# Patient Record
Sex: Female | Born: 1980 | Race: White | Hispanic: No | Marital: Married | Smoking: Former smoker
Health system: Southern US, Community
[De-identification: ages and names within clinical notes are randomized; demographics above are authoritative.]

## PROBLEM LIST (undated history)

## (undated) DIAGNOSIS — K219 Gastro-esophageal reflux disease without esophagitis: Secondary | ICD-10-CM

## (undated) DIAGNOSIS — E039 Hypothyroidism, unspecified: Secondary | ICD-10-CM

## (undated) DIAGNOSIS — F909 Attention-deficit hyperactivity disorder, unspecified type: Secondary | ICD-10-CM

## (undated) DIAGNOSIS — E119 Type 2 diabetes mellitus without complications: Secondary | ICD-10-CM

## (undated) DIAGNOSIS — F329 Major depressive disorder, single episode, unspecified: Secondary | ICD-10-CM

## (undated) DIAGNOSIS — F32A Depression, unspecified: Secondary | ICD-10-CM

## (undated) DIAGNOSIS — T4145XA Adverse effect of unspecified anesthetic, initial encounter: Secondary | ICD-10-CM

## (undated) DIAGNOSIS — F419 Anxiety disorder, unspecified: Secondary | ICD-10-CM

## (undated) DIAGNOSIS — M797 Fibromyalgia: Secondary | ICD-10-CM

## (undated) HISTORY — DX: Anxiety disorder, unspecified: F41.9

## (undated) HISTORY — PX: OTHER SURGICAL HISTORY: SHX169

## (undated) HISTORY — DX: Gastro-esophageal reflux disease without esophagitis: K21.9

## (undated) HISTORY — DX: Hypothyroidism, unspecified: E03.9

## (undated) HISTORY — DX: Attention-deficit hyperactivity disorder, unspecified type: F90.9

---

## 2001-05-01 ENCOUNTER — Other Ambulatory Visit: Admission: RE | Admit: 2001-05-01 | Discharge: 2001-05-01 | Payer: Self-pay | Admitting: Obstetrics & Gynecology

## 2001-09-18 ENCOUNTER — Ambulatory Visit (HOSPITAL_BASED_OUTPATIENT_CLINIC_OR_DEPARTMENT_OTHER): Admission: RE | Admit: 2001-09-18 | Discharge: 2001-09-18 | Payer: Self-pay | Admitting: Orthopedic Surgery

## 2003-12-19 ENCOUNTER — Other Ambulatory Visit: Admission: RE | Admit: 2003-12-19 | Discharge: 2003-12-19 | Payer: Self-pay | Admitting: Obstetrics & Gynecology

## 2004-05-21 ENCOUNTER — Encounter: Admission: RE | Admit: 2004-05-21 | Discharge: 2004-07-06 | Payer: Self-pay | Admitting: Obstetrics & Gynecology

## 2004-07-27 ENCOUNTER — Inpatient Hospital Stay (HOSPITAL_COMMUNITY): Admission: AD | Admit: 2004-07-27 | Discharge: 2004-07-31 | Payer: Self-pay | Admitting: Obstetrics & Gynecology

## 2004-07-28 ENCOUNTER — Encounter (INDEPENDENT_AMBULATORY_CARE_PROVIDER_SITE_OTHER): Payer: Self-pay | Admitting: *Deleted

## 2005-11-17 ENCOUNTER — Ambulatory Visit: Payer: Self-pay | Admitting: Family Medicine

## 2006-09-27 DIAGNOSIS — T8859XA Other complications of anesthesia, initial encounter: Secondary | ICD-10-CM

## 2006-09-27 HISTORY — DX: Other complications of anesthesia, initial encounter: T88.59XA

## 2006-10-22 ENCOUNTER — Emergency Department (HOSPITAL_COMMUNITY): Admission: EM | Admit: 2006-10-22 | Discharge: 2006-10-22 | Payer: Self-pay | Admitting: Emergency Medicine

## 2006-10-22 ENCOUNTER — Encounter: Payer: Self-pay | Admitting: Vascular Surgery

## 2007-03-13 ENCOUNTER — Inpatient Hospital Stay (HOSPITAL_COMMUNITY): Admission: RE | Admit: 2007-03-13 | Discharge: 2007-03-16 | Payer: Self-pay | Admitting: Obstetrics & Gynecology

## 2007-09-05 ENCOUNTER — Encounter: Admission: RE | Admit: 2007-09-05 | Discharge: 2007-09-05 | Payer: Self-pay | Admitting: Family Medicine

## 2010-10-18 ENCOUNTER — Encounter: Payer: Self-pay | Admitting: Family Medicine

## 2010-12-15 ENCOUNTER — Other Ambulatory Visit: Payer: Self-pay | Admitting: Family Medicine

## 2010-12-15 DIAGNOSIS — R1011 Right upper quadrant pain: Secondary | ICD-10-CM

## 2010-12-16 ENCOUNTER — Ambulatory Visit
Admission: RE | Admit: 2010-12-16 | Discharge: 2010-12-16 | Disposition: A | Discharge status: Home / Self Care | Payer: BC Managed Care – PPO | Source: Ambulatory Visit | Attending: Family Medicine | Admitting: Family Medicine

## 2010-12-16 DIAGNOSIS — R1011 Right upper quadrant pain: Secondary | ICD-10-CM

## 2011-02-09 NOTE — Op Note (Signed)
Ashley Mcgrath, Ashley Mcgrath                 ACCOUNT NO.:  [id number]   MEDICAL RECORD NO.:  [id number]          PATIENT TYPE:  INP   LOCATION:  9124                          FACILITY:  [facility]   PHYSICIAN:  Genia Del, M.D.DATE OF BIRTH:  [birth year]   DATE OF PROCEDURE:  03/13/2007  DATE OF DISCHARGE:                               OPERATIVE REPORT   PREOPERATIVE DIAGNOSIS:  38 weeks and 6 days gestation, previous C-  section.   POSTOPERATIVE DIAGNOSIS:  38 weeks and 6 days gestation, previous C-  section.   INTERVENTION:  Repeat low transverse C-section.   SURGEON:  Dr. Genia Del   ASSISTANT:  Marlinda Mike, C.N.M.   PROCEDURE:  Under spinal anesthesia the patient is in 15 degrees left  decubitus position.  She is prepped with Betadine on the abdominal,  suprapubic, and vulvar areas as well as vaginal area. A bladder catheter  is inserted, the Foley left in place and the patient is draped as usual.  The level of anesthesia was verified and is adequate.  We infiltrate 25%  Marcaine 20 mL at the level of the previous C-section scar.  We then  make a Pfannenstiel incision at that level with the scalpel.  We opened  the adipose tissue with the scalpel.  We opened the aponeurosis  transversely with Mayo scissors.  We then separate the aponeurosis from  the recti muscles on the midline with Mayo scissors.  We used  electrocautery to control hemostasis when needed.  We then opened the  parietal peritoneum longitudinally with [facility] scissors.  We put the  bladder retractor in place.  We opened the visceral peritoneum  transversely with Metz scissors, reclined the bladder downward away from  the lower uterine segment.  We then repositioned the bladder retractor.  We make a low transverse hysterotomy with the scalpel.  We extend on  each side with dressing scissors.  The amniotic fluid is clear.  The  fetus is in cephalic position.  Birth of a baby girl at 36. The baby  is suctioned,  the cord is clamped and cut. The baby is given to the  neonatal team.  Apgars are eight and nine. It is a baby girl.  We then  take the cord blood.  We then take cord blood for banking. We expelled  the placenta spontaneously and send it to labor and delivery. Uterine  revision is done.  The uterus contracts well with Pitocin IV. A dose of  Ancef 1 gram IV is given. The two tubes and the two ovaries are normal  to inspection.  The uterus is normal to inspection and well contracted.  We closed the hysterotomy in a first locked running suture of Vicryl 0.  A second layer in a mattress fashion is done with Vicryl 0 and a figure-  of-eight Vicryl 0 stitch is done on the right angle to complete  hemostasis.  Hemostasis is adequate at all levels.  We then irrigate and  suction the abdominopelvic cavity.  We complete hemostasis on the recti  muscles with the electrocautery.  We closed the  aponeurosis with two  half running sutures of Vicryl 0.  We complete hemostasis on the adipose  tissue and we  reapproximated the skin with staples.  Dry dressing is applied.  The  count of instruments and sponges was complete x2.  The estimated blood  loss was 800 mL.  No complications occurred and the patient was  transferred to recovery room in good stable status.      Genia Del, M.D.  Electronically Signed     ML/MEDQ  D:  03/13/2007  T:  03/13/2007  Job:  [id number]

## 2011-02-12 NOTE — Discharge Summary (Signed)
Ashley, Mcgrath                 ACCOUNT NO.:  [id number]   MEDICAL RECORD NO.:  [id number]          PATIENT TYPE:  INP   LOCATION:  9124                          [facility]  [facility]   PHYSICIAN:  Genia Del, M.D.DATE OF BIRTH:  02-29-1981   DATE OF ADMISSION:  03/13/2007  DATE OF DISCHARGE:  03/16/2007                               DISCHARGE SUMMARY   ADMISSION DIAGNOSIS:  38 weeks and 6 days gestation, previous C-section   DISCHARGE DIAGNOSIS:  38 weeks and 6 days gestation, previous C-section.  Birth of a baby girl by C-section.   INTERVENTION:  Repeat low transverse C-section.   HOSPITAL COURSE:  The patient had her repeat low transverse C-section on  March 13, 2007 without complications.  She had a normal postop course and  was discharged home on postop day #3 in stable status. Postop advice  were given.  She will follow-up at [facility] in 6 weeks.  Her  postop hemoglobin was 8.4 and hematocrit 25.2.      Genia Del, M.D.  Electronically Signed     ML/MEDQ  D:  04/23/2007  T:  04/24/2007  Job:  [id number]

## 2011-02-12 NOTE — Op Note (Signed)
[place]  Patient:    Ashley Mcgrath, Ashley Mcgrath Visit Number: [id number] MRN: [id number]          Service Type: DSU Location: [facility] Attending Physician:  Twana First Dictated by:   Elana Alm Thurston Hole, M.D. Proc. Date: 09/18/01 Admit Date:  09/18/2001                             Operative Report  PREOPERATIVE DIAGNOSIS: Left knee medial meniscus tear, with parameniscal cyst.  POSTOPERATIVE DIAGNOSIS: Left knee medial meniscus tear, with parameniscal cyst.  OPERATION/PROCEDURE:  1. Left knee examination under anesthesia followed by arthroscopic partial     medial meniscectomy.  2. L4-5 knee internal meniscal cyst debridement.  SURGEON: Elana Alm. Thurston Hole, M.D.  ASSISTANT: Julien Girt, P.A.  ANESTHESIA: General.  OPERATIVE TIME: Thirty minutes.  COMPLICATIONS: None.  INDICATIONS FOR PROCEDURE: [name] is a 30 year old, who has had over a year of left knee pain with intermittent popping and catching in the knee, with examination and MRI documenting a meniscus tear and a parameniscal cyst, who has failed conservative care and is now to undergo arthroscopy.  DESCRIPTION OF PROCEDURE: [name] was brought to the operating room on September 18, 2001 and placed on the operating room table in the supine position.  After an adequate level of general anesthesia was obtained her left knee was examined under anesthesia.  She had full range of motion and her knee was stable to ligamentous examination, with normal patella tracking.  The left leg was prepped using sterile Betadine and draped using sterile technique. Originally through an inferolateral portal the arthroscope with a pump attached was placed and through an inferomedial portal an arthroscopic probe was placed.  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  The medial meniscus  showed a split horizontal tear of the posterior and medial horn of the medial meniscus which was not repairable and the inferior leaflet was resected.  The superior leaflet remained intact.  The posterior medial corner where the cyst originated was debrided arthroscopically.  After this was done the intercondylar notch was inspected and the anterior and posterior cruciate ligaments were normal.  The lateral compartment was inspected and articular cartilage, lateral femoral condyle, lateral tibial plateau were normal.  The lateral meniscus was probed and this was found to be normal.  The patellofemoral joint was inspected and the articular cartilage in this joint was normal.  The patella tracked normally. Medial and lateral gutters were inspected and found to be free of pathology. After this was done it was felt that all pathology had been satisfactorily addressed.  The instruments were removed.  Portals were closed with 3-0 nylon suture and injected with 0.25% Marcaine with epinephrine and 4 mg of morphine. A sterile dressing was applied and the patient awakened and taken to the recovery room in stable condition.  FOLLOW-UP CARE: Shweta will be followed as an outpatient, on Talwin NX and Naprosyn.  I will see her back in the office in a week for sutures out and follow-up. Dictated by:   Elana Alm Thurston Hole, M.D. Attending Physician:  Twana First DD:  09/18/01 TD:  09/19/01 Job: [id number]

## 2011-02-12 NOTE — Op Note (Signed)
Ashley Mcgrath, Ashley Mcgrath                 ACCOUNT NO.:  [id number]   MEDICAL RECORD NO.:  [id number]          PATIENT TYPE:  INP   LOCATION:  9105                          [facility]ACILITY:  [facility]   PHYSICIAN:  [place] B. Earlene Plater, M.D.  DATE OF BIRTH:  [birth year]   DATE OF PROCEDURE:  07/28/2004  DATE OF DISCHARGE:                                 OPERATIVE REPORT   PREOPERATIVE DIAGNOSES:  1.  Forty-week intrauterine pregnancy.  2.  Pregnancy-induced hypertension.  3.  Failure to progress.  4.  Possible early chorioamnionitis.   POSTOPERATIVE DIAGNOSES:  1.  Forty-week intrauterine pregnancy.  2.  Pregnancy-induced hypertension.  3.  Failure to progress.  4.  Possible early chorioamnionitis.   PROCEDURE:  Primary low transverse cesarean section and cord blood  collection by viacord method.   SURGEON:  Chester Holstein. Earlene Plater, M.D.   ANESTHESIA:  Epidural.   FINDINGS:  A viable female, Apgars 8 and 9, weight 8 pounds 15 ounces, from  OP position, with nuchal cord x1. Normal-appearing uterus, tubes, and  ovaries.   ESTIMATED BLOOD LOSS:  600.   FLUIDS REPLACED:  2100.   URINE OUTPUT:  125.   COMPLICATIONS:  None.   INDICATIONS:  The patient was being induced for mild pregnancy-induced  hypertension.  She progressed slowly to 8 cm.  She would not dilate beyond  this for greater than four hours despite adequate labor documented by [facility].  Therefore, I recommended and she agreed to proceed with a C-section.  In  addition, she had a low-grade temperature of 100.5 and slight decrease in  variability on the tracing, suggestive of possible early mild  chorioamnionitis.  The risks of surgery were discussed prior to the  procedure, including infection, bleeding, damage to bowel, bladder, or  surrounding organs.   PROCEDURE:  The patient was taken to the operating room with epidural  anesthesia in place.  She was prepped and draped in standard fashion and a  Foley catheter was in the bladder.   A  Pfannenstiel incision made with a knife and carried sharply to the fascia.  The fascia was divided sharply.  The superior edge of the incision was  elevated and the underlying rectus muscles dissected off sharply.  Repeated  inferiorly in a similar fashion.   The midline was identified, posterior sheath and peritoneum elevated and  entered sharply.  Extended inferiorly sharply with good visualization of  surrounding organs.   Bladder blade inserted, bladder flap created with sharp and blunt technique.   The uterine incision made in a low transverse fashion with a knife.  Clear  fluid noted at amniotomy.  The incision extended laterally with bandage  scissors.   The infant's head was delivered through the incision and noted to be OP with  a nuchal cord x1, which was easily reduced.  The remainder of the infant was  delivered without difficulty, the cord clamped and cut, and the infant  handed off to the waiting pediatricians.  Ancef was given at cord clamp.   The cord blood was collected with the Viacord collection kit per  protocol.   The placenta was removed manually and the uterus exteriorized and cleared of  all clots and debris.  There was mild uterine atony with associated  hemorrhage, which was treated with double-concentrated Pitocin and a single  intrauterine injection of Hemabate.  Uterine massage was also employed, and  good uterine tone resulted.   The uterine incision was inspected and was free of extension.  The uterus  was cleared of all clots and debris.  The incision was closed in a running  locked stitch of 0 chromic.  A second layer placed of the same suture in  imbricating fashion.  Hemostasis obtained.   The uterus was returned to the abdomen and the pelvis irrigated.  The  uterine incision was inspected.  There were some bleeders along the superior  edge of the peritoneal incision, which were made hemostatic with the Bovie.  The bladder flap was hemostatic, as  was the subfascial space.   The fascia was closed in a running stitch of 0 Vicryl.  The subcutaneous  tissue was irrigated and made hemostatic with the Bovie.  The skin was  closed with staples.   The patient tolerated the procedure well, no complications.  She was taken  to the recovery room in stable condition.  All counts were correct per the  operating room staff.     Wesl   WBD/MEDQ  D:  07/28/2004  T:  07/29/2004  Job:  [id number]

## 2011-02-12 NOTE — Discharge Summary (Signed)
Ashley Mcgrath, Ashley Mcgrath                 ACCOUNT NO.:  [id number]   MEDICAL RECORD NO.:  [id number]          PATIENT TYPE:  INP   LOCATION:  9105                          [facility]   PHYSICIAN:  [place] B. Earlene Plater, M.D.  DATE OF BIRTH:  [birth year]   DATE OF ADMISSION:  07/27/2004  DATE OF DISCHARGE:  07/31/2004                                 DISCHARGE SUMMARY   ADMISSION DIAGNOSES:  1.  A 40-6/7 weeks intra-uterine pregnancy.  2.  Pregnancy-induced hypertension.  3.  Failure to progress beyond 8 cm.  4.  Chorioamnionitis.   DISCHARGE DIAGNOSES:  1.  A 40-6/7 weeks intra-uterine pregnancy.  2.  Pregnancy-induced hypertension.  3.  Failure to progress beyond 8 cm.  4.  Chorioamnionitis.   PROCEDURE:  Admission for induction of labor and subsequent primary low  transverse cesarean section for failure to progress.   HISTORY AND PHYSICAL:  For complete details, please see the H&P in the  chart.  Briefly, the patient was admitted at 40-6/7 weeks with elevated  blood pressure for induction of labor.  She was initially induced with  Pitocin and amniotomy.  She progressed to 8 cm but not beyond that despite  adequate contractions documented by intrauterine pressure catheter.  She did  not change for greater than four hours with adequate contractions and  therefore I recommended to proceed with a C-section.  The patient was  subsequently delivered by a primary low transverse cesarean section, viable  female infant, Apgars 8 and 9, weight 8 pounds 15 ounces OP position with a  nuchal cord x1.  Normal uterus, tubes and ovaries noted.   Postoperatively, the patient rapidly regained her ability to ambulate, void  and tolerate a regular diet.  Her blood pressure normalized after delivery.  She was discharged home on the third postoperative day in satisfactory  condition.   DISCHARGE INFORMATION:  Standard postpartum instructions given prior to  dismissal.   DISCHARGE MEDICATIONS:  Tylox 1-2  p.o. q.4-6h. p.r.n. pain and ferrous  sulfate 325 mg p.o. b.i.d.   FOLLOW UP:  [facility] in four weeks for postpartum visit.   DISPOSITION AT DISCHARGE:  Satisfactory.    Wesl  WBD/MEDQ  D:  08/21/2004  T:  08/21/2004  Job:  [id number]

## 2011-07-14 LAB — CBC
HCT: 25.2 — ABNORMAL LOW
MCHC: 33.3
MCV: 76.5 — ABNORMAL LOW

## 2011-07-14 LAB — RPR: RPR Ser Ql: NONREACTIVE

## 2011-07-15 LAB — CBC
HCT: 31.9 — ABNORMAL LOW
RBC: 4.16
WBC: 10.2

## 2011-11-05 ENCOUNTER — Other Ambulatory Visit: Payer: Self-pay | Admitting: Family Medicine

## 2011-11-05 DIAGNOSIS — R1011 Right upper quadrant pain: Secondary | ICD-10-CM

## 2011-11-09 ENCOUNTER — Ambulatory Visit
Admission: RE | Admit: 2011-11-09 | Discharge: 2011-11-09 | Disposition: A | Discharge status: Home / Self Care | Payer: BC Managed Care – PPO | Source: Ambulatory Visit | Attending: Family Medicine | Admitting: Family Medicine

## 2011-11-09 DIAGNOSIS — R1011 Right upper quadrant pain: Secondary | ICD-10-CM

## 2011-11-19 ENCOUNTER — Other Ambulatory Visit: Payer: Self-pay | Admitting: Gastroenterology

## 2011-11-19 DIAGNOSIS — R1013 Epigastric pain: Secondary | ICD-10-CM

## 2011-11-26 ENCOUNTER — Ambulatory Visit
Admission: RE | Admit: 2011-11-26 | Discharge: 2011-11-26 | Disposition: A | Discharge status: Home / Self Care | Payer: BC Managed Care – PPO | Source: Ambulatory Visit | Attending: Gastroenterology | Admitting: Gastroenterology

## 2011-11-26 DIAGNOSIS — R1013 Epigastric pain: Secondary | ICD-10-CM

## 2012-12-27 ENCOUNTER — Ambulatory Visit (INDEPENDENT_AMBULATORY_CARE_PROVIDER_SITE_OTHER): Payer: BC Managed Care – PPO | Admitting: Physician Assistant

## 2012-12-27 ENCOUNTER — Encounter: Payer: Self-pay | Admitting: Physician Assistant

## 2012-12-27 VITALS — BP 98/68 | HR 77 | Temp 98.1°F | Ht 64.0 in | Wt 151.0 lb

## 2012-12-27 DIAGNOSIS — F988 Other specified behavioral and emotional disorders with onset usually occurring in childhood and adolescence: Secondary | ICD-10-CM

## 2012-12-27 MED ORDER — LISDEXAMFETAMINE DIMESYLATE 40 MG PO CAPS: 40.0000 mg | ORAL_CAPSULE | ORAL | Status: DC

## 2012-12-28 NOTE — Progress Notes (Signed)
  Subjective:    Patient ID: Ashley Mcgrath, female    DOB: 12/06/1980, 32 y.o.   MRN: [id number]  HPI Did not like the jittery feeling while using ritalin, requesting to resume Vyvanse    Review of Systems  All other systems reviewed and are negative.       Objective:   Physical Exam  Vitals reviewed. Constitutional: She is oriented to person, place, and time. She appears well-developed and well-nourished.  HENT:  Head: Normocephalic and atraumatic.  Cardiovascular: Normal rate, regular rhythm and normal heart sounds.   Pulmonary/Chest: Effort normal and breath sounds normal.  Neurological: She is alert and oriented to person, place, and time.  Skin: Skin is warm and dry.  Psychiatric: She has a normal mood and affect. Her behavior is normal. Judgment and thought content normal.          Assessment & Plan:  ADD (attention deficit disorder) - Plan: lisdexamfetamine (VYVANSE) 40 MG capsule

## 2013-01-29 ENCOUNTER — Ambulatory Visit: Payer: BC Managed Care – PPO | Admitting: Family Medicine

## 2013-01-30 ENCOUNTER — Other Ambulatory Visit: Payer: Self-pay

## 2013-01-30 DIAGNOSIS — F988 Other specified behavioral and emotional disorders with onset usually occurring in childhood and adolescence: Secondary | ICD-10-CM

## 2013-01-30 MED ORDER — LISDEXAMFETAMINE DIMESYLATE 40 MG PO CAPS: 40.0000 mg | ORAL_CAPSULE | ORAL | Status: DC

## 2013-01-30 NOTE — Telephone Encounter (Signed)
Pt notified that rx up front and ready to pick up

## 2013-01-30 NOTE — Telephone Encounter (Signed)
Last filled 12/27/12   Last seen 12/27/12   Print and have nurse call patient to pick up

## 2013-01-30 NOTE — Telephone Encounter (Signed)
rx ready for pickup 

## 2013-02-10 ENCOUNTER — Other Ambulatory Visit: Payer: Self-pay | Admitting: Nurse Practitioner

## 2013-02-12 NOTE — Telephone Encounter (Signed)
Has appt with you on 03/01/13, last saw ACM on 12/27/12. Chart sent back

## 2013-02-28 ENCOUNTER — Encounter: Payer: Self-pay | Admitting: Family Medicine

## 2013-02-28 ENCOUNTER — Ambulatory Visit (INDEPENDENT_AMBULATORY_CARE_PROVIDER_SITE_OTHER): Payer: BC Managed Care – PPO | Admitting: Family Medicine

## 2013-02-28 VITALS — BP 98/69 | HR 68 | Temp 98.2°F | Ht 64.25 in | Wt 143.0 lb

## 2013-02-28 DIAGNOSIS — T50905D Adverse effect of unspecified drugs, medicaments and biological substances, subsequent encounter: Secondary | ICD-10-CM

## 2013-02-28 DIAGNOSIS — Z5189 Encounter for other specified aftercare: Secondary | ICD-10-CM

## 2013-02-28 DIAGNOSIS — F988 Other specified behavioral and emotional disorders with onset usually occurring in childhood and adolescence: Secondary | ICD-10-CM

## 2013-02-28 DIAGNOSIS — E039 Hypothyroidism, unspecified: Secondary | ICD-10-CM

## 2013-02-28 LAB — THYROID PANEL WITH TSH
Free Thyroxine Index: 2.7 (ref 1.0–3.9)
T3 Uptake: 37.6 % — ABNORMAL HIGH (ref 22.5–37.0)
T4, Total: 7.2 ug/dL (ref 5.0–12.5)
TSH: 4.083 u[IU]/mL (ref 0.350–4.500)

## 2013-02-28 LAB — POCT CBC
Granulocyte percent: 52.5 %G (ref 37–80)
HCT, POC: 42.7 % (ref 37.7–47.9)
Hemoglobin: 14.7 g/dL (ref 12.2–16.2)
Lymph, poc: 2.6 (ref 0.6–3.4)
MCH, POC: 28.8 pg (ref 27–31.2)
MCHC: 34.8 g/dL (ref 31.8–35.4)
MCV: 82.9 fL (ref 80–97)
MPV: 9.4 fL (ref 0–99.8)
POC Granulocyte: 3.4 (ref 2–6.9)
POC LYMPH PERCENT: 40.6 %L (ref 10–50)
Platelet Count, POC: 251 10*3/uL (ref 142–424)
RBC: 5.1 M/uL (ref 4.04–5.48)
RDW, POC: 14.2 %
WBC: 6.5 10*3/uL (ref 4.6–10.2)

## 2013-02-28 MED ORDER — LISDEXAMFETAMINE DIMESYLATE 40 MG PO CAPS: 40.0000 mg | ORAL_CAPSULE | ORAL | Status: DC

## 2013-02-28 NOTE — Progress Notes (Signed)
Quick Note:  Call patient. Labs normal. No change in plan. ______ 

## 2013-02-28 NOTE — Progress Notes (Signed)
Patient ID: Ashley Mcgrath, female   DOB: [birth year], 32 y.o.   MRN: [id number] SUBJECTIVE: Chief Complaint  Patient presents with  . ADD    RF MEDS   HPI: Doing well. Hasn't taken any levothyroxine for several weeks and wanted to use natural products. Came to have labs done to check on the thyroid. Has a history of goiter. Was evaluated in the past. (work up reviewed in [employer]) VyVanse jhad worked well for her and she is able to stay focussed and not not lose concentration as she did before.couldn't focus beyond a few minutes.   PMH/PSH: reviewed/updated in Epic  SH/FH: reviewed/updated in Epic, works part time at preschool. Going back to school.  Allergies: reviewed/updated in Epic  Medications: reviewed/updated in Epic  Immunizations: reviewed/updated in Epic  ROS: As above in the HPI. All other systems are stable or negative.  OBJECTIVE: APPEARANCE:  Patient in no acute distress.The patient appeared well nourished and normally developed. Acyanotic. Waist: VITAL SIGNS:BP 98/69  Pulse 68  Temp(Src) 98.2 F (36.8 C) (Oral)  Ht 5' 4.25" (1.632 m)  Wt 143 lb (64.864 kg)  BMI 24.35 kg/m2  LMP 02/24/2013 WF  SKIN: warm and  Dry without overt rashes, tattoos and scars  HEAD and Neck: without JVD, Head and scalp: normal Eyes:No scleral icterus. Fundi normal, eye movements normal. Ears: Auricle normal, canal normal, Tympanic membranes normal, insufflation normal. Nose: normal Throat: normal Neck & thyroid:neck is supple. Thyroid homogenously mildly enlarged.  CHEST & LUNGS: Chest wall: normal Lungs: Clear  CVS: Reveals the PMI to be normally located. Regular rhythm, First and Second Heart sounds are normal,  absence of murmurs, rubs or gallops. Peripheral vasculature: Radial pulses: normal Dorsal pedis pulses: normal Posterior pulses: normal  ABDOMEN:  Appearance: normal Benign, no organomegaly, no masses, no Abdominal Aortic enlargement. No Guarding , no  rebound. No Bruits. Bowel sounds: normal  RECTAL: N/A GU: N/A  EXTREMETIES: nonedematous. Both Femoral and Pedal pulses are normal.  MUSCULOSKELETAL:  Spine: normal Joints: intact  NEUROLOGIC: oriented to time,place and person; nonfocal. Strength is normal Sensory is normal Reflexes are normal Cranial Nerves are normal.  ASSESSMENT: Unspecified hypothyroidism - Plan: Thyroid Panel With TSH  ADD (attention deficit disorder) - Plan: lisdexamfetamine (VYVANSE) 40 MG capsule  Medication adverse effect, subsequent encounter - Plan: POCT CBC   PLAN: Discussed with patient need for compliance, discussed with patient her views on using natural thyroid products and the fallacies involved. Await labs to determine dosage restart. Orders Placed This Encounter  Procedures  . Thyroid Panel With TSH  . POCT CBC   Meds ordered this encounter  Medications  . lisdexamfetamine (VYVANSE) 40 MG capsule    Sig: Take 1 capsule (40 mg total) by mouth every morning.    Dispense:  30 capsule    Refill:  0    Order Specific Question:  Supervising Provider    Answer:  Ernestina Penna [[id number]]   counselled goiter.       Dr Woodroe Mode Recommendations  Diet and Exercise discussed with patient.  For nutrition information, I recommend books:  1).Eat to Live by Dr Monico Hoar. 2).Prevent and Reverse Heart Disease by Dr Suzzette Righter. 3) Dr Katherina Right Book: Reversing Diabetes  Exercise recommendations are:  If unable to walk, then the patient can exercise in a chair 3 times a day. By flapping arms like a bird gently and raising legs outwards to the front.  If ambulatory, the patient can go for  walks for 30 minutes 3 times a week. Then increase the intensity and duration as tolerated.  Goal is to try to attain exercise frequency to 5 times a week.  If applicable: Best to perform resistance exercises (machines or weights) 2 days a week and cardio type exercises 3 days per  week. Return in about 6 months (around 08/30/2013) for Recheck medical problems.  Shatiqua Heroux P. Modesto Charon, M.D.

## 2013-02-28 NOTE — Patient Instructions (Addendum)
      Dr Shuntae Herzig's Recommendations  Diet and Exercise discussed with patient.  For nutrition information, I recommend books:  1).Eat to Live by Dr Joel Fuhrman. 2).Prevent and Reverse Heart Disease by Dr Caldwell Esselstyn. 3) Dr Neal Barnard's Book: Reversing Diabetes  Exercise recommendations are:  If unable to walk, then the patient can exercise in a chair 3 times a day. By flapping arms like a bird gently and raising legs outwards to the front.  If ambulatory, the patient can go for walks for 30 minutes 3 times a week. Then increase the intensity and duration as tolerated.  Goal is to try to attain exercise frequency to 5 times a week.  If applicable: Best to perform resistance exercises (machines or weights) 2 days a week and cardio type exercises 3 days per week.  

## 2013-04-10 ENCOUNTER — Ambulatory Visit: Payer: BC Managed Care – PPO | Admitting: Family Medicine

## 2013-04-12 ENCOUNTER — Encounter: Payer: Self-pay | Admitting: Physician Assistant

## 2013-04-12 ENCOUNTER — Ambulatory Visit (INDEPENDENT_AMBULATORY_CARE_PROVIDER_SITE_OTHER): Payer: BC Managed Care – PPO | Admitting: Physician Assistant

## 2013-04-12 VITALS — BP 99/69 | HR 64 | Temp 98.4°F | Ht 64.25 in | Wt 146.0 lb

## 2013-04-12 DIAGNOSIS — E039 Hypothyroidism, unspecified: Secondary | ICD-10-CM | POA: Insufficient documentation

## 2013-04-12 DIAGNOSIS — F988 Other specified behavioral and emotional disorders with onset usually occurring in childhood and adolescence: Secondary | ICD-10-CM

## 2013-04-12 DIAGNOSIS — F909 Attention-deficit hyperactivity disorder, unspecified type: Secondary | ICD-10-CM | POA: Insufficient documentation

## 2013-04-12 MED ORDER — LEVOTHYROXINE SODIUM 75 MCG PO TABS: 75.0000 ug | ORAL_TABLET | Freq: Every day | ORAL | Status: DC

## 2013-04-12 MED ORDER — LISDEXAMFETAMINE DIMESYLATE 40 MG PO CAPS: 40.0000 mg | ORAL_CAPSULE | ORAL | Status: DC

## 2013-04-12 NOTE — Progress Notes (Signed)
Subjective:     Patient ID: Ashley Mcgrath, female   DOB: 01-Feb-1981, 32 y.o.   MRN: [id number]  HPI Pt here needing rf of her ADHD med She also is needing rf of her thyroid med Seen last month for same She has been intermit taking her thyroid med due to concerns of taking on empty stomach in the am Even taking intermit on last visit TSH was in nl range Pt doing well on Vyvanse She is in a new job and it really helps her stay focused and mange daily activities Pt does not take on the weekends or when on vacation Review of Systems  All other systems reviewed and are negative.       Objective:   Physical Exam  Nursing note and vitals reviewed. Discussed with pt this am regarding the thyroid med and her concerns Spent 15 min face to face with pt regarding ADHD and med concerns     Assessment:     ADHD Hypothyroidism    Plan:     Pt agreed to take Synthroid daily Recommended repeat labs in 3 months All meds cont today Rf of Vyvanse x 1 only Synthroid rf x 3 F/U in 3 months for OV and labs

## 2013-04-12 NOTE — Patient Instructions (Signed)

## 2013-05-04 ENCOUNTER — Telehealth: Payer: Self-pay | Admitting: Family Medicine

## 2013-05-04 NOTE — Telephone Encounter (Signed)
ERROR- WRONG PT./ROBIN SHAFFER

## 2013-05-04 NOTE — Telephone Encounter (Signed)
No appts available today. Offered pt appt for Sat clinic. States that she will see how she is in the am and call us if NTBS

## 2013-05-09 ENCOUNTER — Telehealth: Payer: Self-pay

## 2013-05-09 NOTE — Telephone Encounter (Signed)
Wants a referral to Dr. Lendon Colonel  Rheumatology

## 2013-05-09 NOTE — Telephone Encounter (Signed)
For what reason?   

## 2013-05-10 ENCOUNTER — Other Ambulatory Visit: Payer: Self-pay | Admitting: Physician Assistant

## 2013-05-10 ENCOUNTER — Other Ambulatory Visit: Payer: Self-pay | Admitting: Nurse Practitioner

## 2013-05-10 DIAGNOSIS — F988 Other specified behavioral and emotional disorders with onset usually occurring in childhood and adolescence: Secondary | ICD-10-CM

## 2013-05-10 MED ORDER — LISDEXAMFETAMINE DIMESYLATE 40 MG PO CAPS: 40.0000 mg | ORAL_CAPSULE | ORAL | Status: DC

## 2013-05-16 ENCOUNTER — Telehealth: Payer: Self-pay | Admitting: Nurse Practitioner

## 2013-05-16 NOTE — Telephone Encounter (Signed)
LM for patient to pick up.

## 2013-05-17 ENCOUNTER — Ambulatory Visit: Payer: BC Managed Care – PPO | Admitting: Family Medicine

## 2013-06-18 ENCOUNTER — Other Ambulatory Visit: Payer: Self-pay | Admitting: Nurse Practitioner

## 2013-06-18 DIAGNOSIS — F988 Other specified behavioral and emotional disorders with onset usually occurring in childhood and adolescence: Secondary | ICD-10-CM

## 2013-06-19 MED ORDER — LISDEXAMFETAMINE DIMESYLATE 40 MG PO CAPS: 40.0000 mg | ORAL_CAPSULE | ORAL | Status: DC

## 2013-06-19 NOTE — Telephone Encounter (Signed)
Last filled 05/10/13, last seen by Montey Hora on 04/12/13, rx will print call when ready

## 2013-06-19 NOTE — Telephone Encounter (Signed)
rx ready for pickup 

## 2013-06-21 NOTE — Telephone Encounter (Signed)
Up front 

## 2013-07-05 ENCOUNTER — Other Ambulatory Visit: Payer: Self-pay | Admitting: Family Medicine

## 2013-07-06 ENCOUNTER — Other Ambulatory Visit: Payer: Self-pay | Admitting: Family Medicine

## 2013-07-06 MED ORDER — ALPRAZOLAM 0.5 MG PO TABS: ORAL_TABLET | ORAL | Status: DC

## 2013-07-23 ENCOUNTER — Encounter: Payer: Self-pay | Admitting: Nurse Practitioner

## 2013-07-23 ENCOUNTER — Ambulatory Visit (INDEPENDENT_AMBULATORY_CARE_PROVIDER_SITE_OTHER): Payer: BC Managed Care – PPO | Admitting: Nurse Practitioner

## 2013-07-23 VITALS — BP 116/77 | HR 94 | Temp 98.3°F | Ht 64.25 in | Wt 138.0 lb

## 2013-07-23 DIAGNOSIS — E039 Hypothyroidism, unspecified: Secondary | ICD-10-CM

## 2013-07-23 DIAGNOSIS — F988 Other specified behavioral and emotional disorders with onset usually occurring in childhood and adolescence: Secondary | ICD-10-CM

## 2013-07-23 DIAGNOSIS — M542 Cervicalgia: Secondary | ICD-10-CM

## 2013-07-23 MED ORDER — LISDEXAMFETAMINE DIMESYLATE 50 MG PO CAPS: 50.0000 mg | ORAL_CAPSULE | ORAL | Status: DC

## 2013-07-23 MED ORDER — LEVOTHYROXINE SODIUM 75 MCG PO TABS: 75.0000 ug | ORAL_TABLET | Freq: Every day | ORAL | Status: DC

## 2013-07-23 MED ORDER — DULOXETINE HCL 60 MG PO CPEP: 60.0000 mg | ORAL_CAPSULE | Freq: Every day | ORAL | Status: DC

## 2013-07-23 NOTE — Progress Notes (Signed)
  Subjective:    Patient ID: Ashley Mcgrath, female    DOB: 25-Oct-1980, 32 y.o.   MRN: [id number]  HPI  Patient here today for follow up of hypthyroidism- she is currently on levothyroxin daily- doing well- no c/o fatigue, hair loss or skin problems. Adhd- Vyvanse 40 mg 1 po daily- helps her to concentrate- has been taking for greater than 4 years- doesn't seem to work as well as it use to. Would like to increase dose. C/O trouble sleeping- has been on ambien in the past but she did not like how it made her feel so she stopped taking- tinks like trouble sleeping due to pain in her body that she has all the time. Pain is mainly in shouders and neck   Review of Systems  Constitutional: Negative.   HENT: Negative.   Respiratory: Negative.   Cardiovascular: Negative.   Gastrointestinal: Negative.   Genitourinary: Negative.   Musculoskeletal: Negative.   Neurological: Negative.   Hematological: Negative.   Psychiatric/Behavioral: Negative.        Objective:   Physical Exam  Constitutional: She is oriented to person, place, and time. She appears well-developed and well-nourished.  HENT:  Nose: Nose normal.  Mouth/Throat: Oropharynx is clear and moist.  Eyes: EOM are normal.  Neck: Trachea normal, normal range of motion and full passive range of motion without pain. Neck supple. No JVD present. Carotid bruit is not present. No thyromegaly present.  Cardiovascular: Normal rate, regular rhythm, normal heart sounds and intact distal pulses.  Exam reveals no gallop and no friction rub.   No murmur heard. Pulmonary/Chest: Effort normal and breath sounds normal.  Abdominal: Soft. Bowel sounds are normal. She exhibits no distension and no mass. There is no tenderness.  Musculoskeletal: Normal range of motion.  FROM of cervial spine without pain FROM bil shoulder with pain on full abduction bil  Lymphadenopathy:    She has no cervical adenopathy.  Neurological: She is alert and oriented to  person, place, and time. She has normal reflexes.  Skin: Skin is warm and dry.  Psychiatric: She has a normal mood and affect. Her behavior is normal. Judgment and thought content normal.    BP 116/77  Pulse 94  Temp(Src) 98.3 F (36.8 C) (Oral)  Ht 5' 4.25" (1.632 m)  Wt 138 lb (62.596 kg)  BMI 23.5 kg/m2       Assessment & Plan:  1. ADD (attention deficit disorder) - lisdexamfetamine (VYVANSE) 50 MG capsule; Take 1 capsule (50 mg total) by mouth every morning.  Dispense: 30 capsule; Refill: 0 - lisdexamfetamine (VYVANSE) 50 MG capsule; Take 1 capsule (50 mg total) by mouth every morning.  Dispense: 30 capsule; Refill: 0  2. Hypothyroid - levothyroxine (SYNTHROID, LEVOTHROID) 75 MCG tablet; Take 1 tablet (75 mcg total) by mouth daily before breakfast.  Dispense: 1 tablet; Refill: 2 - CMP14+EGFR - NMR, lipoprofile - Thyroid Panel With TSH  3. Neck pain Stress management - DULoxetine (CYMBALTA) 60 MG capsule; Take 1 capsule (60 mg total) by mouth daily.  Dispense: 30 capsule; Refill: 3  Health maintenance reviewed Messages  Mary-Margaret Daphine Deutscher, FNP

## 2013-07-23 NOTE — Patient Instructions (Signed)

## 2013-07-25 ENCOUNTER — Telehealth: Payer: Self-pay | Admitting: Nurse Practitioner

## 2013-07-25 LAB — CMP14+EGFR
Albumin/Globulin Ratio: 1.8 (ref 1.1–2.5)
BUN/Creatinine Ratio: 12 (ref 8–20)
BUN: 8 mg/dL (ref 6–20)
Calcium: 10 mg/dL (ref 8.7–10.2)
Creatinine, Ser: 0.67 mg/dL (ref 0.57–1.00)
GFR calc Af Amer: 134 mL/min/{1.73_m2} (ref >59)
Sodium: 142 mmol/L (ref 134–144)

## 2013-07-25 LAB — THYROID PANEL WITH TSH: T4, Total: 9 ug/dL (ref 4.5–12.0)

## 2013-07-25 LAB — NMR, LIPOPROFILE
HDL Particle Number: 35.5 umol/L (ref >=30.5)
LDLC SERPL CALC-MCNC: 85 mg/dL (ref <100)
Triglycerides by NMR: 97 mg/dL (ref <150)

## 2013-07-26 ENCOUNTER — Telehealth: Payer: Self-pay | Admitting: Family Medicine

## 2013-07-26 MED ORDER — DULOXETINE HCL 30 MG PO CPEP: 30.0000 mg | ORAL_CAPSULE | Freq: Every day | ORAL | Status: DC

## 2013-07-26 NOTE — Telephone Encounter (Signed)
Decreased cymbalta to 30 mg - rx sent to pharmacy

## 2013-07-30 NOTE — Telephone Encounter (Signed)
Pt aware.

## 2013-08-02 ENCOUNTER — Other Ambulatory Visit: Payer: Self-pay

## 2013-08-02 ENCOUNTER — Encounter: Payer: Self-pay | Admitting: *Deleted

## 2013-08-24 ENCOUNTER — Other Ambulatory Visit: Payer: Self-pay | Admitting: Nurse Practitioner

## 2013-08-28 ENCOUNTER — Ambulatory Visit: Payer: BC Managed Care – PPO | Admitting: Nurse Practitioner

## 2013-09-20 ENCOUNTER — Other Ambulatory Visit: Payer: Self-pay | Admitting: Family Medicine

## 2013-09-24 NOTE — Telephone Encounter (Signed)
Last seen 07/23/13  MMM  If approved route to nurse to call into [facility] 

## 2013-09-24 NOTE — Telephone Encounter (Signed)
This is okay with no refill

## 2013-09-27 DIAGNOSIS — E119 Type 2 diabetes mellitus without complications: Secondary | ICD-10-CM

## 2013-09-27 HISTORY — DX: Type 2 diabetes mellitus without complications: E11.9

## 2013-10-01 ENCOUNTER — Telehealth: Payer: Self-pay | Admitting: Nurse Practitioner

## 2013-10-01 DIAGNOSIS — F988 Other specified behavioral and emotional disorders with onset usually occurring in childhood and adolescence: Secondary | ICD-10-CM

## 2013-10-01 MED ORDER — LISDEXAMFETAMINE DIMESYLATE 40 MG PO CAPS: 40.0000 mg | ORAL_CAPSULE | ORAL | Status: DC

## 2013-10-01 NOTE — Telephone Encounter (Signed)
Message left rx up front to pick up

## 2013-10-01 NOTE — Telephone Encounter (Signed)
rx ready for pickup 

## 2013-11-20 ENCOUNTER — Telehealth: Payer: Self-pay | Admitting: Nurse Practitioner

## 2013-11-20 MED ORDER — VALACYCLOVIR HCL 1 G PO TABS: ORAL_TABLET | ORAL | Status: DC

## 2013-11-20 NOTE — Telephone Encounter (Signed)
rx sent to pharmacy

## 2013-11-30 ENCOUNTER — Telehealth: Payer: Self-pay | Admitting: Family Medicine

## 2013-11-30 DIAGNOSIS — F988 Other specified behavioral and emotional disorders with onset usually occurring in childhood and adolescence: Secondary | ICD-10-CM

## 2013-11-30 MED ORDER — LISDEXAMFETAMINE DIMESYLATE 40 MG PO CAPS: 40.0000 mg | ORAL_CAPSULE | ORAL | Status: DC

## 2013-11-30 NOTE — Telephone Encounter (Signed)
Patient aware to pick up 

## 2013-11-30 NOTE — Telephone Encounter (Signed)
rx ready for pick up Ntbs for next refill

## 2013-12-19 ENCOUNTER — Other Ambulatory Visit: Payer: Self-pay | Admitting: Family Medicine

## 2013-12-20 NOTE — Telephone Encounter (Signed)
Patient last seen in office on 07-23-13. Rx last filled on 09-25-13. Please advise. If approved please route to Pool B so nurse can phone in to pharmacy

## 2013-12-20 NOTE — Telephone Encounter (Signed)
ntbs for xanax refill 

## 2013-12-21 NOTE — Telephone Encounter (Signed)
Detailed message left that patient needs to be seen to have refilled

## 2014-01-30 LAB — OB RESULTS CONSOLE RUBELLA ANTIBODY, IGM: RUBELLA: IMMUNE

## 2014-01-30 LAB — OB RESULTS CONSOLE ABO/RH: RH Type: POSITIVE

## 2014-01-30 LAB — OB RESULTS CONSOLE HEPATITIS B SURFACE ANTIGEN: HEP B S AG: NEGATIVE

## 2014-01-30 LAB — OB RESULTS CONSOLE HIV ANTIBODY (ROUTINE TESTING): HIV: NONREACTIVE

## 2014-01-30 LAB — OB RESULTS CONSOLE ANTIBODY SCREEN: ANTIBODY SCREEN: NEGATIVE

## 2014-02-13 LAB — OB RESULTS CONSOLE GC/CHLAMYDIA
CHLAMYDIA, DNA PROBE: NEGATIVE
GC PROBE AMP, GENITAL: NEGATIVE

## 2014-06-19 LAB — OB RESULTS CONSOLE GBS: GBS: POSITIVE

## 2014-06-19 LAB — OB RESULTS CONSOLE RPR: RPR: NONREACTIVE

## 2014-08-05 ENCOUNTER — Other Ambulatory Visit: Payer: Self-pay | Admitting: Obstetrics & Gynecology

## 2014-08-21 ENCOUNTER — Other Ambulatory Visit: Payer: Self-pay | Admitting: Obstetrics & Gynecology

## 2014-08-26 ENCOUNTER — Encounter (HOSPITAL_COMMUNITY): Payer: Self-pay

## 2014-08-26 ENCOUNTER — Encounter (HOSPITAL_COMMUNITY)
Admission: RE | Admit: 2014-08-26 | Discharge: 2014-08-26 | Disposition: A | Discharge status: Home / Self Care | Payer: BC Managed Care – PPO | Source: Ambulatory Visit | Attending: Obstetrics & Gynecology | Admitting: Obstetrics & Gynecology

## 2014-08-26 HISTORY — DX: Major depressive disorder, single episode, unspecified: F32.9

## 2014-08-26 HISTORY — DX: Adverse effect of unspecified anesthetic, initial encounter: T41.45XA

## 2014-08-26 HISTORY — DX: Fibromyalgia: M79.7

## 2014-08-26 HISTORY — DX: Type 2 diabetes mellitus without complications: E11.9

## 2014-08-26 HISTORY — DX: Depression, unspecified: F32.A

## 2014-08-26 LAB — BASIC METABOLIC PANEL
Anion gap: 15 (ref 5–15)
BUN: 6 mg/dL (ref 6–23)
CALCIUM: 9.3 mg/dL (ref 8.4–10.5)
CO2: 24 mEq/L (ref 19–32)
Chloride: 100 mEq/L (ref 96–112)
Creatinine, Ser: 0.77 mg/dL (ref 0.50–1.10)
GFR calc Af Amer: >90 mL/min (ref >90)
Glucose, Bld: 65 mg/dL — ABNORMAL LOW (ref 70–99)
Potassium: 4 mEq/L (ref 3.7–5.3)
SODIUM: 139 meq/L (ref 137–147)

## 2014-08-26 LAB — CBC
HEMATOCRIT: 35.2 % — AB (ref 36.0–46.0)
Hemoglobin: 11.4 g/dL — ABNORMAL LOW (ref 12.0–15.0)
MCH: 25.7 pg — AB (ref 26.0–34.0)
MCHC: 32.4 g/dL (ref 30.0–36.0)
MCV: 79.3 fL (ref 78.0–100.0)
PLATELETS: 138 10*3/uL — AB (ref 150–400)
RBC: 4.44 MIL/uL (ref 3.87–5.11)
RDW: 13.8 % (ref 11.5–15.5)
WBC: 8.7 10*3/uL (ref 4.0–10.5)

## 2014-08-26 LAB — TYPE AND SCREEN
ABO/RH(D): A POS
Antibody Screen: NEGATIVE

## 2014-08-26 LAB — ABO/RH: ABO/RH(D): A POS

## 2014-08-26 LAB — RPR

## 2014-08-26 NOTE — Patient Instructions (Signed)
Your procedure is scheduled on:08/27/14  Enter through the Main Entrance at :0930 am Pick up desk phone and dial [phone] and inform us of your arrival.  Please call [phone] if you have any problems the morning of surgery.  Remember: Do not eat food after midnight:tonight Clear liquids are ok until:7am   You may brush your teeth the morning of surgery.  Take these meds the morning of surgery with a sip of water:thyroid pill  DO NOT wear jewelry, eye make-up, lipstick,body lotion, or dark fingernail polish.  (Polished toes are ok) You may wear deodorant.  If you are to be admitted after surgery, leave suitcase in car until your room has been assigned. Patients discharged on the day of surgery will not be allowed to drive home. Wear loose fitting, comfortable clothes for your ride home.

## 2014-08-26 NOTE — Pre-Procedure Instructions (Signed)
Dr. Malen GauzeFoster notified of platelets result of 138. He's ok with the result.

## 2014-08-27 ENCOUNTER — Inpatient Hospital Stay (HOSPITAL_COMMUNITY): Payer: BC Managed Care – PPO | Admitting: Anesthesiology

## 2014-08-27 ENCOUNTER — Encounter (HOSPITAL_COMMUNITY)
Admission: RE | Disposition: A | Discharge status: Home / Self Care | Payer: Self-pay | Source: Ambulatory Visit | Attending: Obstetrics & Gynecology

## 2014-08-27 ENCOUNTER — Encounter (HOSPITAL_COMMUNITY): Payer: Self-pay | Admitting: *Deleted

## 2014-08-27 ENCOUNTER — Inpatient Hospital Stay (HOSPITAL_COMMUNITY)
Admission: RE | Admit: 2014-08-27 | Discharge: 2014-08-29 | DRG: 765 | Disposition: A | Discharge status: Home / Self Care | Payer: BC Managed Care – PPO | Source: Ambulatory Visit | Attending: Obstetrics & Gynecology | Admitting: Obstetrics & Gynecology

## 2014-08-27 DIAGNOSIS — K219 Gastro-esophageal reflux disease without esophagitis: Secondary | ICD-10-CM | POA: Diagnosis present

## 2014-08-27 DIAGNOSIS — O3663X0 Maternal care for excessive fetal growth, third trimester, not applicable or unspecified: Secondary | ICD-10-CM | POA: Diagnosis present

## 2014-08-27 DIAGNOSIS — D62 Acute posthemorrhagic anemia: Secondary | ICD-10-CM | POA: Diagnosis present

## 2014-08-27 DIAGNOSIS — O24420 Gestational diabetes mellitus in childbirth, diet controlled: Secondary | ICD-10-CM | POA: Diagnosis present

## 2014-08-27 DIAGNOSIS — O9902 Anemia complicating childbirth: Secondary | ICD-10-CM | POA: Diagnosis present

## 2014-08-27 DIAGNOSIS — Z302 Encounter for sterilization: Secondary | ICD-10-CM | POA: Diagnosis not present

## 2014-08-27 DIAGNOSIS — Z87891 Personal history of nicotine dependence: Secondary | ICD-10-CM

## 2014-08-27 DIAGNOSIS — O403XX0 Polyhydramnios, third trimester, not applicable or unspecified: Secondary | ICD-10-CM | POA: Diagnosis present

## 2014-08-27 DIAGNOSIS — O3421 Maternal care for scar from previous cesarean delivery: Secondary | ICD-10-CM | POA: Diagnosis present

## 2014-08-27 DIAGNOSIS — F909 Attention-deficit hyperactivity disorder, unspecified type: Secondary | ICD-10-CM | POA: Diagnosis present

## 2014-08-27 DIAGNOSIS — Z3A38 38 weeks gestation of pregnancy: Secondary | ICD-10-CM | POA: Diagnosis present

## 2014-08-27 DIAGNOSIS — O99613 Diseases of the digestive system complicating pregnancy, third trimester: Secondary | ICD-10-CM | POA: Diagnosis present

## 2014-08-27 DIAGNOSIS — O99284 Endocrine, nutritional and metabolic diseases complicating childbirth: Secondary | ICD-10-CM | POA: Diagnosis present

## 2014-08-27 DIAGNOSIS — E039 Hypothyroidism, unspecified: Secondary | ICD-10-CM | POA: Diagnosis present

## 2014-08-27 DIAGNOSIS — O24410 Gestational diabetes mellitus in pregnancy, diet controlled: Secondary | ICD-10-CM | POA: Diagnosis present

## 2014-08-27 DIAGNOSIS — O99343 Other mental disorders complicating pregnancy, third trimester: Secondary | ICD-10-CM | POA: Diagnosis present

## 2014-08-27 DIAGNOSIS — Z9889 Other specified postprocedural states: Secondary | ICD-10-CM

## 2014-08-27 LAB — GLUCOSE, CAPILLARY
Glucose-Capillary: 59 mg/dL — ABNORMAL LOW (ref 70–99)
Glucose-Capillary: 71 mg/dL (ref 70–99)

## 2014-08-27 LAB — PLATELET COUNT: Platelets: 143 10*3/uL — ABNORMAL LOW (ref 150–400)

## 2014-08-27 SURGERY — Surgical Case
Anesthesia: Spinal | Site: Abdomen | Laterality: Bilateral

## 2014-08-27 MED ORDER — LEVOTHYROXINE SODIUM 75 MCG PO TABS
75.0000 ug | ORAL_TABLET | Freq: Every day | ORAL | Status: DC
  Administered 2014-08-28 – 2014-08-29 (×2): 75 ug via ORAL
  Filled 2014-08-27 (×2): qty 1

## 2014-08-27 MED ORDER — MAGNESIUM HYDROXIDE 400 MG/5ML PO SUSP: 30.0000 mL | ORAL | Status: DC | PRN

## 2014-08-27 MED ORDER — ONDANSETRON HCL 4 MG/2ML IJ SOLN
INTRAMUSCULAR | Status: DC | PRN
  Administered 2014-08-27: 4 mg via INTRAVENOUS

## 2014-08-27 MED ORDER — LANOLIN HYDROUS EX OINT: 1.0000 "application " | TOPICAL_OINTMENT | CUTANEOUS | Status: DC | PRN

## 2014-08-27 MED ORDER — MORPHINE SULFATE (PF) 0.5 MG/ML IJ SOLN
INTRAMUSCULAR | Status: DC | PRN
  Administered 2014-08-27: .1 mg via INTRATHECAL

## 2014-08-27 MED ORDER — IBUPROFEN 600 MG PO TABS
600.0000 mg | ORAL_TABLET | Freq: Four times a day (QID) | ORAL | Status: DC
  Administered 2014-08-27 – 2014-08-29 (×7): 600 mg via ORAL
  Filled 2014-08-27 (×7): qty 1

## 2014-08-27 MED ORDER — ONDANSETRON HCL 4 MG/2ML IJ SOLN
INTRAMUSCULAR | Status: AC
  Filled 2014-08-27: qty 2

## 2014-08-27 MED ORDER — NALBUPHINE HCL 10 MG/ML IJ SOLN: 5.0000 mg | INTRAMUSCULAR | Status: DC | PRN

## 2014-08-27 MED ORDER — SCOPOLAMINE 1 MG/3DAYS TD PT72: 1.0000 | MEDICATED_PATCH | Freq: Once | TRANSDERMAL | Status: DC

## 2014-08-27 MED ORDER — MENTHOL 3 MG MT LOZG: 1.0000 | LOZENGE | OROMUCOSAL | Status: DC | PRN

## 2014-08-27 MED ORDER — PHENYLEPHRINE 8 MG IN D5W 100 ML (0.08MG/ML) PREMIX OPTIME
INJECTION | INTRAVENOUS | Status: AC
  Filled 2014-08-27: qty 100

## 2014-08-27 MED ORDER — NALBUPHINE HCL 10 MG/ML IJ SOLN: 5.0000 mg | Freq: Once | INTRAMUSCULAR | Status: AC | PRN

## 2014-08-27 MED ORDER — MEPERIDINE HCL 25 MG/ML IJ SOLN: 6.2500 mg | INTRAMUSCULAR | Status: DC | PRN

## 2014-08-27 MED ORDER — SENNOSIDES-DOCUSATE SODIUM 8.6-50 MG PO TABS
2.0000 | ORAL_TABLET | ORAL | Status: DC
  Administered 2014-08-27 – 2014-08-28 (×2): 2 via ORAL
  Filled 2014-08-27 (×2): qty 2

## 2014-08-27 MED ORDER — CEFAZOLIN SODIUM-DEXTROSE 2-3 GM-% IV SOLR
2.0000 g | INTRAVENOUS | Status: AC
  Administered 2014-08-27: 2 g via INTRAVENOUS

## 2014-08-27 MED ORDER — DIPHENHYDRAMINE HCL 25 MG PO CAPS: 25.0000 mg | ORAL_CAPSULE | Freq: Four times a day (QID) | ORAL | Status: DC | PRN

## 2014-08-27 MED ORDER — ONDANSETRON HCL 4 MG/2ML IJ SOLN: 4.0000 mg | INTRAMUSCULAR | Status: DC | PRN

## 2014-08-27 MED ORDER — NALOXONE HCL 0.4 MG/ML IJ SOLN: 0.4000 mg | INTRAMUSCULAR | Status: DC | PRN

## 2014-08-27 MED ORDER — LACTATED RINGERS IV SOLN
INTRAVENOUS | Status: DC
  Administered 2014-08-27 – 2014-08-28 (×2): via INTRAVENOUS

## 2014-08-27 MED ORDER — PHENYLEPHRINE 8 MG IN D5W 100 ML (0.08MG/ML) PREMIX OPTIME
INJECTION | INTRAVENOUS | Status: DC | PRN
  Administered 2014-08-27: 60 ug/min via INTRAVENOUS

## 2014-08-27 MED ORDER — BUPIVACAINE HCL (PF) 0.25 % IJ SOLN
INTRAMUSCULAR | Status: DC | PRN
  Administered 2014-08-27: 20 mL

## 2014-08-27 MED ORDER — DIBUCAINE 1 % RE OINT: 1.0000 "application " | TOPICAL_OINTMENT | RECTAL | Status: DC | PRN

## 2014-08-27 MED ORDER — KETOROLAC TROMETHAMINE 30 MG/ML IJ SOLN
30.0000 mg | Freq: Four times a day (QID) | INTRAMUSCULAR | Status: DC | PRN
  Administered 2014-08-27: 30 mg via INTRAVENOUS

## 2014-08-27 MED ORDER — KETOROLAC TROMETHAMINE 30 MG/ML IJ SOLN
INTRAMUSCULAR | Status: AC
  Administered 2014-08-27: 30 mg via INTRAVENOUS
  Filled 2014-08-27: qty 1

## 2014-08-27 MED ORDER — DEXAMETHASONE SODIUM PHOSPHATE 10 MG/ML IJ SOLN
INTRAMUSCULAR | Status: AC
  Filled 2014-08-27: qty 1

## 2014-08-27 MED ORDER — MORPHINE SULFATE 0.5 MG/ML IJ SOLN
INTRAMUSCULAR | Status: AC
  Filled 2014-08-27: qty 10

## 2014-08-27 MED ORDER — DIPHENHYDRAMINE HCL 25 MG PO CAPS: 25.0000 mg | ORAL_CAPSULE | ORAL | Status: DC | PRN

## 2014-08-27 MED ORDER — WITCH HAZEL-GLYCERIN EX PADS: 1.0000 "application " | MEDICATED_PAD | CUTANEOUS | Status: DC | PRN

## 2014-08-27 MED ORDER — SIMETHICONE 80 MG PO CHEW
80.0000 mg | CHEWABLE_TABLET | ORAL | Status: DC | PRN
  Administered 2014-08-28: 80 mg via ORAL

## 2014-08-27 MED ORDER — SIMETHICONE 80 MG PO CHEW
80.0000 mg | CHEWABLE_TABLET | ORAL | Status: DC
  Administered 2014-08-27 – 2014-08-28 (×2): 80 mg via ORAL
  Filled 2014-08-27 (×2): qty 1

## 2014-08-27 MED ORDER — CEFAZOLIN SODIUM-DEXTROSE 2-3 GM-% IV SOLR
INTRAVENOUS | Status: AC
  Filled 2014-08-27: qty 50

## 2014-08-27 MED ORDER — ZOLPIDEM TARTRATE 5 MG PO TABS: 5.0000 mg | ORAL_TABLET | Freq: Every evening | ORAL | Status: DC | PRN

## 2014-08-27 MED ORDER — OXYTOCIN 10 UNIT/ML IJ SOLN
INTRAMUSCULAR | Status: AC
  Filled 2014-08-27: qty 4

## 2014-08-27 MED ORDER — PHENYLEPHRINE HCL 10 MG/ML IJ SOLN
INTRAMUSCULAR | Status: AC
  Filled 2014-08-27: qty 1

## 2014-08-27 MED ORDER — KETOROLAC TROMETHAMINE 30 MG/ML IJ SOLN: 30.0000 mg | Freq: Four times a day (QID) | INTRAMUSCULAR | Status: DC | PRN

## 2014-08-27 MED ORDER — SODIUM CHLORIDE 0.9 % IJ SOLN: 3.0000 mL | INTRAMUSCULAR | Status: DC | PRN

## 2014-08-27 MED ORDER — BUPIVACAINE HCL (PF) 0.25 % IJ SOLN
INTRAMUSCULAR | Status: AC
  Filled 2014-08-27: qty 30

## 2014-08-27 MED ORDER — FENTANYL CITRATE 0.05 MG/ML IJ SOLN
INTRAMUSCULAR | Status: AC
  Filled 2014-08-27: qty 2

## 2014-08-27 MED ORDER — ONDANSETRON HCL 4 MG/2ML IJ SOLN: 4.0000 mg | Freq: Three times a day (TID) | INTRAMUSCULAR | Status: DC | PRN

## 2014-08-27 MED ORDER — OXYTOCIN 10 UNIT/ML IJ SOLN
40.0000 [IU] | INTRAVENOUS | Status: DC | PRN
  Administered 2014-08-27: 40 [IU] via INTRAVENOUS

## 2014-08-27 MED ORDER — OXYTOCIN 40 UNITS IN LACTATED RINGERS INFUSION - SIMPLE MED: 62.5000 mL/h | INTRAVENOUS | Status: AC

## 2014-08-27 MED ORDER — FERROUS SULFATE 325 (65 FE) MG PO TABS
325.0000 mg | ORAL_TABLET | Freq: Two times a day (BID) | ORAL | Status: DC
  Administered 2014-08-28 – 2014-08-29 (×3): 325 mg via ORAL
  Filled 2014-08-27 (×3): qty 1

## 2014-08-27 MED ORDER — ONDANSETRON HCL 4 MG PO TABS: 4.0000 mg | ORAL_TABLET | ORAL | Status: DC | PRN

## 2014-08-27 MED ORDER — SIMETHICONE 80 MG PO CHEW
80.0000 mg | CHEWABLE_TABLET | Freq: Three times a day (TID) | ORAL | Status: DC
  Administered 2014-08-28 – 2014-08-29 (×3): 80 mg via ORAL
  Filled 2014-08-27 (×4): qty 1

## 2014-08-27 MED ORDER — NALOXONE HCL 1 MG/ML IJ SOLN: 1.0000 ug/kg/h | INTRAVENOUS | Status: DC | PRN

## 2014-08-27 MED ORDER — FENTANYL CITRATE 0.05 MG/ML IJ SOLN
INTRAMUSCULAR | Status: DC | PRN
  Administered 2014-08-27: 25 ug via INTRATHECAL

## 2014-08-27 MED ORDER — OXYCODONE-ACETAMINOPHEN 5-325 MG PO TABS: 2.0000 | ORAL_TABLET | ORAL | Status: DC | PRN

## 2014-08-27 MED ORDER — TETANUS-DIPHTH-ACELL PERTUSSIS 5-2.5-18.5 LF-MCG/0.5 IM SUSP: 0.5000 mL | Freq: Once | INTRAMUSCULAR | Status: DC

## 2014-08-27 MED ORDER — PRENATAL MULTIVITAMIN CH
1.0000 | ORAL_TABLET | Freq: Every day | ORAL | Status: DC
  Administered 2014-08-28 – 2014-08-29 (×2): 1 via ORAL
  Filled 2014-08-27 (×2): qty 1

## 2014-08-27 MED ORDER — OXYCODONE-ACETAMINOPHEN 5-325 MG PO TABS
1.0000 | ORAL_TABLET | ORAL | Status: DC | PRN
  Administered 2014-08-28 (×2): 1 via ORAL
  Filled 2014-08-27 (×2): qty 1

## 2014-08-27 MED ORDER — DIPHENHYDRAMINE HCL 50 MG/ML IJ SOLN: 12.5000 mg | INTRAMUSCULAR | Status: DC | PRN

## 2014-08-27 MED ORDER — FENTANYL CITRATE 0.05 MG/ML IJ SOLN
INTRAMUSCULAR | Status: AC
  Administered 2014-08-27: 50 ug via INTRAVENOUS
  Filled 2014-08-27: qty 2

## 2014-08-27 MED ORDER — FENTANYL CITRATE 0.05 MG/ML IJ SOLN
25.0000 ug | INTRAMUSCULAR | Status: DC | PRN
  Administered 2014-08-27 (×2): 50 ug via INTRAVENOUS

## 2014-08-27 MED ORDER — SCOPOLAMINE 1 MG/3DAYS TD PT72
MEDICATED_PATCH | TRANSDERMAL | Status: AC
  Filled 2014-08-27: qty 1

## 2014-08-27 MED ORDER — DEXAMETHASONE SODIUM PHOSPHATE 10 MG/ML IJ SOLN
INTRAMUSCULAR | Status: DC | PRN
  Administered 2014-08-27: 10 mg via INTRAVENOUS

## 2014-08-27 MED ORDER — LACTATED RINGERS IV SOLN
INTRAVENOUS | Status: DC
  Administered 2014-08-27 (×2): via INTRAVENOUS

## 2014-08-27 SURGICAL SUPPLY — 40 items
CLAMP CORD UMBIL (MISCELLANEOUS) ×3 IMPLANT
CLOTH BEACON ORANGE TIMEOUT ST (SAFETY) ×3 IMPLANT
CONTAINER PREFILL 10% NBF 15ML (MISCELLANEOUS) ×6 IMPLANT
DRAPE SHEET LG 3/4 BI-LAMINATE (DRAPES) ×3 IMPLANT
DRSG OPSITE POSTOP 4X10 (GAUZE/BANDAGES/DRESSINGS) ×3 IMPLANT
DURAPREP 26ML APPLICATOR (WOUND CARE) ×3 IMPLANT
ELECT REM PT RETURN 9FT ADLT (ELECTROSURGICAL) ×3
ELECTRODE REM PT RTRN 9FT ADLT (ELECTROSURGICAL) ×1 IMPLANT
EXTRACTOR VACUUM M CUP 4 TUBE (SUCTIONS) IMPLANT
EXTRACTOR VACUUM M CUP 4' TUBE (SUCTIONS)
GLOVE BIO SURGEON STRL SZ 6.5 (GLOVE) ×2 IMPLANT
GLOVE BIO SURGEONS STRL SZ 6.5 (GLOVE) ×1
GLOVE BIOGEL PI IND STRL 7.0 (GLOVE) ×1 IMPLANT
GLOVE BIOGEL PI INDICATOR 7.0 (GLOVE) ×2
GOWN STRL REUS W/TWL LRG LVL3 (GOWN DISPOSABLE) ×6 IMPLANT
KIT ABG SYR 3ML LUER SLIP (SYRINGE) IMPLANT
LIQUID BAND (GAUZE/BANDAGES/DRESSINGS) ×3 IMPLANT
NEEDLE HYPO 22GX1.5 SAFETY (NEEDLE) ×3 IMPLANT
NEEDLE HYPO 25X5/8 SAFETYGLIDE (NEEDLE) IMPLANT
PACK C SECTION WH (CUSTOM PROCEDURE TRAY) ×3 IMPLANT
PAD OB MATERNITY 4.3X12.25 (PERSONAL CARE ITEMS) ×3 IMPLANT
RTRCTR C-SECT PINK 25CM LRG (MISCELLANEOUS) ×3 IMPLANT
STAPLER VISISTAT 35W (STAPLE) IMPLANT
SUT MNCRL AB 3-0 PS2 27 (SUTURE) ×3 IMPLANT
SUT PLAIN 0 NONE (SUTURE) ×3 IMPLANT
SUT PLAIN 2 0 (SUTURE) ×3
SUT PLAIN ABS 2-0 CT1 27XMFL (SUTURE) ×1 IMPLANT
SUT VIC AB 0 CT1 27 (SUTURE) ×6
SUT VIC AB 0 CT1 27XBRD ANBCTR (SUTURE) ×2 IMPLANT
SUT VIC AB 0 CTX 36 (SUTURE) ×6
SUT VIC AB 0 CTX36XBRD ANBCTRL (SUTURE) ×2 IMPLANT
SUT VIC AB 2-0 CT1 27 (SUTURE) ×9
SUT VIC AB 2-0 CT1 TAPERPNT 27 (SUTURE) ×3 IMPLANT
SUT VIC AB 3-0 SH 27 (SUTURE) ×3
SUT VIC AB 3-0 SH 27X BRD (SUTURE) ×1 IMPLANT
SUT VIC AB 4-0 PS2 27 (SUTURE) ×3 IMPLANT
SYR CONTROL 10ML LL (SYRINGE) ×3 IMPLANT
TOWEL OR 17X24 6PK STRL BLUE (TOWEL DISPOSABLE) ×3 IMPLANT
TRAY FOLEY CATH 14FR (SET/KITS/TRAYS/PACK) ×3 IMPLANT
WATER STERILE IRR 1000ML POUR (IV SOLUTION) IMPLANT

## 2014-08-27 NOTE — Anesthesia Preprocedure Evaluation (Signed)
Anesthesia Evaluation    Airway Mallampati: II       Dental no notable dental hx. (+) Teeth Intact   Pulmonary former smoker,  breath sounds clear to auscultation        Cardiovascular negative cardio ROS  Rhythm:Regular Rate:Normal     Neuro/Psych PSYCHIATRIC DISORDERS Anxiety Depression ADHD Neuromuscular disease    GI/Hepatic Neg liver ROS, GERD-  Medicated and Controlled,  Endo/Other  diabetesHypothyroidism   Renal/GU negative Renal ROS     Musculoskeletal  (+) Fibromyalgia -  Abdominal (+) - obese,   Peds  Hematology   Anesthesia Other Findings   Reproductive/Obstetrics                             Anesthesia Physical Anesthesia Plan  ASA: II  Anesthesia Plan: Spinal   Post-op Pain Management:    Induction:   Airway Management Planned: Natural Airway  Additional Equipment:   Intra-op Plan:   Post-operative Plan:   Informed Consent: I have reviewed the patients History and Physical, chart, labs and discussed the procedure including the risks, benefits and alternatives for the proposed anesthesia with the patient or authorized representative who has indicated his/her understanding and acceptance.     Plan Discussed with: Anesthesiologist, CRNA and Surgeon  Anesthesia Plan Comments:         Anesthesia Quick Evaluation

## 2014-08-27 NOTE — Op Note (Signed)
Preoperative diagnosis: Intrauterine pregnancy at 39+ weeks                                            Gestational Diabetes Mellitus A1                                            Polyhydramnios                                            Desire for Sterilization  Post operative diagnosis: Same  Anesthesia: Spinal  Anesthesiologist: Dr. Cristela BlueKyle Jackson  Procedure: Repeat low transverse cesarean section                      Modified Pomeroy Bilateral Tubal Sterilization  Surgeon: Dr. Genia DelMarie-Lyne Clarece Drzewiecki  Assistant: Denton Meekolita Dawson   Estimated blood loss: 700 cc  Procedure:  After being informed of the planned procedure and possible complications including bleeding, infection, injury to other organs, informed consent is obtained. The patient is taken to OR #2 and given spinal anesthesia without complication. She is placed in the dorsal decubitus position with the pelvis tilted to the left. She is then prepped and draped in a sterile fashion. A Foley catheter is inserted in her bladder.  After assessing adequate level of anesthesia, we infiltrate the suprapubic area with 20 cc of Marcaine 0.25 and perform a Pfannenstiel incision which is brought down sharply to the fascia. The fascia is entered in a low transverse fashion. Linea alba is dissected. Peritoneum is entered in a midline fashion. An Alexis retractor is easily positioned. Visceral peritoneum is entered in a low transverse fashion allowing us to safely retract bladder by developing a bladder flap.  The myometrium is then entered in a low transverse fashion; first with knife and then extended bluntly. Amniotic fluid is clear. We assist the birth of a female  infant in cephalic presentation. Mouth and nose are suctioned. The baby is delivered. The cord is clamped and sectioned. The baby is given to the neonatologist present in the room.  10 cc of blood is drawn from the umbilical vein.The placenta is allowed to deliver spontaneously. It is  complete and the cord has 3 vessels. Uterine revision is negative.  We proceed with closure of the myometrium in 2 layers: First with a running locked suture of 0 Vicryl, then with a Lembert suture of 0 Vicryl imbricating the first one. Hemostasis is completed with cauterization on peritoneal edges.  Both paracolic gutters are cleaned. Both tubes and ovaries are assessed and normal.  We confirm a satisfactory hemostasis.  A Bilateral Tubal Sterilization is done by Modified Pomeroy technique.  Good hemostasis is confirmed.  Retractors and sponges are removed. Under fascia hemostasis is completed with cauterization.  The Parietal Peritoneum is closed with a running suture of Vicryl 2-0.  The fascia is then closed with 2 running sutures of 0 Vicryl meeting midline. The wound is irrigated with warm saline and hemostasis is completed with cauterization. The skin is closed with a subcuticular suture of 3-0 Monocryl and Dermabond.  A Honeycomb dressing is added.  Instrument and sponge count is complete  x2. Estimated blood loss is 700 cc.  The procedure is well tolerated by the patient who is taken to recovery room in a well and stable condition.  female baby was born at 13:36 and received an Apgar of 9  at 1 minute and 9 at 5 minutes. Weight was pending.    Specimen: Placenta sent to pathology.   Dewey Viens,MARIE-LYNE MD 12/1/201512:56 PM

## 2014-08-27 NOTE — Progress Notes (Signed)
CBG 59.. DR. Malen GauzeFOSTER AWARE

## 2014-08-27 NOTE — Anesthesia Procedure Notes (Signed)
Spinal Patient location during procedure: OR Preanesthetic Checklist Completed: patient identified, site marked, surgical consent, pre-op evaluation, timeout performed, IV checked, risks and benefits discussed and monitors and equipment checked Spinal Block Patient position: sitting Prep: DuraPrep Patient monitoring: heart rate, cardiac monitor, continuous pulse ox and blood pressure Approach: midline Location: L3-4 Injection technique: single-shot Needle Needle type: Sprotte  Needle gauge: 24 G Needle length: 9 cm Assessment Sensory level: T4 Additional Notes Spinal Dosage in OR  Bupivicaine ml       1.5 PFMS04   mcg        100 Fentanyl mcg            25    

## 2014-08-27 NOTE — Anesthesia Postprocedure Evaluation (Signed)
  Anesthesia Post-op Note  Patient: Ashley Mcgrath  Procedure(s) Performed: Procedure(s) with comments: Repeat CESAREAN SECTION WITH BILATERAL TUBAL LIGATION  (Bilateral) - EDD: 09/04/14  Patient is awake, responsive, moving her legs, and has signs of resolution of her numbness. Pain and nausea are reasonably well controlled. Vital signs are stable and clinically acceptable. Oxygen saturation is clinically acceptable. There are no apparent anesthetic complications at this time. Patient is ready for discharge.

## 2014-08-27 NOTE — Transfer of Care (Signed)
Immediate Anesthesia Transfer of Care Note  Patient: Ashley Mcgrath  Procedure(s) Performed: Procedure(s) with comments: Repeat CESAREAN SECTION WITH BILATERAL TUBAL LIGATION  (Bilateral) - EDD: 09/04/14  Patient Location: PACU  Anesthesia Type:Spinal  Level of Consciousness: awake, alert  and oriented  Airway & Oxygen Therapy: Patient Spontanous Breathing  Post-op Assessment: Report given to PACU RN and Post -op Vital signs reviewed and stable  Post vital signs: Reviewed and stable  Complications: No apparent anesthesia complications

## 2014-08-27 NOTE — H&P (Signed)
Ashley Mcgrath is a 33 y.o. female [phone] presenting for repeat C/S (3rd) with BT/S.  HPP/HPI:  GDMA1 with Macrosomia and Polyhydramnios.  FMs present.  No reg UCs.  No vaginal bleeding.  No AF leak.  No PEC Sx.  OB History    Gravida Para Term Preterm AB TAB SAB Ectopic Multiple Living   5 2 2  2  2   1      2nd baby died of Neuroblastoma  Past Medical History  Diagnosis Date  . GERD (gastroesophageal reflux disease)   . Anxiety   . Hypothyroid   . ADHD (attention deficit hyperactivity disorder)   . Depression   . Diabetes mellitus without complication 2015    Gestational DM  . Fibromyalgia   . Complication of anesthesia 2008    during CS after baby delivered...pt states she blacked out.   Past Surgical History  Procedure Laterality Date  . Cesarean section    . Knee surgery x 2     Family History: family history includes Diabetes in her father; Heart disease in her mother. Social History:  reports that she quit smoking about 2 years ago. Her smoking use included Cigarettes. She smoked 0.00 packs per day. She does not have any smokeless tobacco history on file. She reports that she drinks alcohol. She reports that she does not use illicit drugs.  No Known Allergies    There were no vitals taken for this visit. Exam Physical Exam  HPP:  Patient Active Problem List   Diagnosis Date Noted  . Unspecified hypothyroidism 04/12/2013  . ADHD (attention deficit hyperactivity disorder) 04/12/2013    Prenatal labs: ABO, Rh: --/--/A POS, A POS (11/30 1535) Antibody: NEG (11/30 1535) Rubella:  Immune RPR: NON REAC (11/30 1535)  HBsAg: Negative (05/06 0000)  HIV: Non-reactive (05/06 0000)  Genetic testing: AFP1 neg US anato: wnl 1 hr GTT: Abnormal.  GDMA1 GBS: Positive (09/23 0000)   Assessment/Plan: 39+ wks for repeat C/S (3rd) with modified Pomeroy BT/S.   GDMA1 Macrosomia/Polyhydramnios.  Surgery and risks discussed.   Ashaun Gaughan,MARIE-LYNE 08/27/2014, 8:11 AM

## 2014-08-27 NOTE — OR Nursing (Signed)
Pt. Brought back to Short stay due to Dr. Seymour BarsLavoie having a vaginal delivery in L&D

## 2014-08-28 ENCOUNTER — Encounter (HOSPITAL_COMMUNITY): Payer: Self-pay | Admitting: Obstetrics & Gynecology

## 2014-08-28 DIAGNOSIS — E039 Hypothyroidism, unspecified: Secondary | ICD-10-CM | POA: Diagnosis present

## 2014-08-28 DIAGNOSIS — D62 Acute posthemorrhagic anemia: Secondary | ICD-10-CM | POA: Diagnosis not present

## 2014-08-28 DIAGNOSIS — O24410 Gestational diabetes mellitus in pregnancy, diet controlled: Secondary | ICD-10-CM | POA: Diagnosis present

## 2014-08-28 LAB — CBC
HCT: 24.5 % — ABNORMAL LOW (ref 36.0–46.0)
HEMOGLOBIN: 8.1 g/dL — AB (ref 12.0–15.0)
MCH: 26.2 pg (ref 26.0–34.0)
MCHC: 33.1 g/dL (ref 30.0–36.0)
MCV: 79.3 fL (ref 78.0–100.0)
Platelets: 123 10*3/uL — ABNORMAL LOW (ref 150–400)
RBC: 3.09 MIL/uL — ABNORMAL LOW (ref 3.87–5.11)
RDW: 13.8 % (ref 11.5–15.5)
WBC: 16.1 10*3/uL — ABNORMAL HIGH (ref 4.0–10.5)

## 2014-08-28 MED ORDER — MAGNESIUM OXIDE 400 (241.3 MG) MG PO TABS
200.0000 mg | ORAL_TABLET | Freq: Every day | ORAL | Status: DC
  Administered 2014-08-28: 200 mg via ORAL
  Filled 2014-08-28 (×2): qty 0.5

## 2014-08-28 MED ORDER — HYDROCODONE-ACETAMINOPHEN 5-325 MG PO TABS
1.0000 | ORAL_TABLET | ORAL | Status: DC | PRN
  Administered 2014-08-28 – 2014-08-29 (×5): 1 via ORAL
  Filled 2014-08-28 (×5): qty 1

## 2014-08-28 NOTE — Plan of Care (Signed)
Problem: Phase I Progression Outcomes Goal: Foley catheter patent Outcome: Completed/Met Date Met:  08/28/14 Goal: OOB as tolerated unless otherwise ordered Outcome: Completed/Met Date Met:  08/28/14 Goal: IS, TCDB as ordered Outcome: Completed/Met Date Met:  08/28/14 Goal: VS, stable, temp < 100.4 degrees F Outcome: Completed/Met Date Met:  08/28/14 Goal: Initial discharge plan identified Outcome: Completed/Met Date Met:  08/28/14  Problem: Phase II Progression Outcomes Goal: Tolerating diet Outcome: Completed/Met Date Met:  08/28/14

## 2014-08-28 NOTE — Progress Notes (Signed)
POD # 1  Subjective: Pt reports feeling well/ Pain controlled with Motrin and Percoet Tolerating po/ Foley d/c'd and voiding without problems/ No n/v/ Flatus present Activity: ad lib Bleeding is light Newborn info:  Information for the patient's newborn:  Ashley Mcgrath, Ashley Mcgrath [[id number]][[id number]]  female   Feeding: breast   Objective:  VS:  Filed Vitals:   08/27/14 2149 08/27/14 2245 08/28/14 0240 08/28/14 0637  BP: 114/80 128/77 120/78 111/62  Pulse: 78  65 66  Temp:  98.2 F (36.8 C) 98.1 F (36.7 C) 97.9 F (36.6 C)  TempSrc:  Oral Oral   Resp: 20 18 18 18   SpO2:  98% 97% 96%     I&O: Intake/Output      12/01 0701 - 12/02 0700 12/02 0701 - 12/03 0700   P.O.  240   I.V. 2200 1252.1   Total Intake 2200 1492.1   Urine 1850 300   Blood 700    Total Output 2550 300   Net -350 +1192.1           Recent Labs  08/26/14 1535 08/27/14 0950 08/28/14 0540  WBC 8.7  --  16.1*  HGB 11.4*  --  8.1*  HCT 35.2*  --  24.5*  PLT 138* 143* 123*    Blood type: --/--/A POS, A POS (11/30 1535) Rubella: Immune (05/06 0000)    Physical Exam:  General: alert and cooperative CV: Regular rate and rhythm Resp: CTA bilaterally Abdomen: soft, nontender, normal bowel sounds Incision: healing well, no drainage, no erythema, no hernia, no seroma, no swelling, well approximated, honeycomb dsg c/d/i Uterine Fundus: firm, below umbilicus, nontender Lochia: minimal Ext: extremities normal, atraumatic, no cyanosis or edema and Homans sign is negative, no sign of DVT    Assessment: POD # 1/ G5P3021/ S/P C/Section & BTL d/t repeat  IDA with compounding ABL anemia A1GDM, delivered Hypothyroidism Doing well  Plan: Ambulate Continue routine post op orders Plan early discharge tomorrow Declines Flu and Tdap vaccine   Signed: Donette LarryBHAMBRI, Justino Boze, N, MSN, CNM 08/28/2014, 10:09 AM

## 2014-08-28 NOTE — Plan of Care (Signed)
Problem: Phase I Progression Outcomes Goal: Pain controlled with appropriate interventions Outcome: Completed/Met Date Met:  08/28/14 Goal: Other Phase I Outcomes/Goals Outcome: Completed/Met Date Met:  08/28/14

## 2014-08-28 NOTE — Plan of Care (Signed)
Problem: Phase II Progression Outcomes Goal: Pain controlled on oral analgesia Outcome: Completed/Met Date Met:  08/28/14 Goal: Progress activity as tolerated unless otherwise ordered Outcome: Completed/Met Date Met:  08/28/14 Goal: Afebrile, VS remain stable Outcome: Completed/Met Date Met:  08/28/14

## 2014-08-28 NOTE — Progress Notes (Signed)
Clinical Social Work Department BRIEF PSYCHOSOCIAL ASSESSMENT 2014-03-25  Patient:  Ashley Mcgrath     Account Number:  [id number]     Admit date:  09-Oct-2013  Clinical Social Worker:  Ashley Mcgrath, CLINICAL SOCIAL WORKER  Date/Time:  June 06, 2014 01:15 PM  Referred by:  RN  Date Referred:  02-21-14 Referred for     MOB presents with history of depression and anxiety.   Interview type:  Family  PSYCHOSOCIAL DATA Living Status:  FAMILY Primary support name:  Ashley Mcgrath Primary support relationship to patient:  PARTNER Degree of support available:   MOB reported additional support from her mother and sisters. She shared belief that she is well supported.    CURRENT CONCERNS Current Concerns  Other - See comment   Other Concerns:   MOB presents with history of depression and anxiety, secondary to her daugther's death in 27-Jun-2010 (daugther died of cancer).   SOCIAL WORK ASSESSMENT / PLAN Ashley Mcgrath met with the MOB due to maternal history of depression and anxiety.  MOB provided consent for the FOB to be present for the visit.  MOB was observed to be in a pleasant mood as she displayed a full range in affect and welcomed Ashley Mcgrath into her room. MOB stated that she is "feeling good", and discussed that she has slowly been increasing mobility in anticipation of returning home tomorrow.  MOB introduced Ashley Mcgrath to the infant, Ashley Mcgrath, and she and the FOB continued to proceed to share how she chose their daugther's name.  The MOB openly discussed the loss of her second daughter in 2011 to cancer, and shared that she chose "Ashley Mcgrath" since her daughter's name was Ashley Mcgrath.  MOB expressed gratitude for Ashley Mcgrath, despite it "catching [place] by surprise", since she is looking forward to having another daughter.  She shared that her oldest daughter (age 63) is an eager big sister and is looking forward to having a baby in the home.  MOB shared belief that she is well supported by her family, and discussed the  preparations that she engaged in to ensure that the home was ready for the baby's arrival.  The MOB shared that she put forth an effort to "not stress" while she prepared for the baby since she wanted to enjoy the experience more.  MOB did not identify any acute stressors that may negatively impact her transition into the postpartum period.   The MOB openly discussed history of anxiety and depression.  She shared that her second daughter was diagnosed with cancer when she was 34 months old, and then died when she was 33 years old (in 06/27/10).  She shared that she was prescribed Zoloft due to the grief/loss she experienced, and shared that she discontinued the medication after one year since she was "ready" to move forward and "feel life again".  She shared that it has been a positive experience these past two years without the medications, and denied any symptoms of depression and anxiety during the pregnancy.  MOB also denied history of postpartum depression.  MOB presented as receptive to education on postpartum depression, and shared belief that she can talk to the FOB and her MD if she notes symptoms.  Ashley Mcgrath normalized potential feelings from her previous grief/loss that may resurface as a result of the this baby's birth.    Throughout the visit, the MOB presented with insight and self-awareness related to her grief and loss, and demonstrated appropriate coping and ability to move forward from her past experiences.  No barriers to discharge.     Assessment/plan status:  No Further Intervention Required Other assessment/ plan:   Ashley Mcgrath follow-up with MOB PRN.   Information/referral to community resources:   No referrals needed at this time.   PATIENT'S/FAMILY'S RESPONSE TO PLAN OF CARE: MOB expressed gratitude and apprecation for the visit.  She stated that she will closely monitor for postpartum depression and will consult her MD if she notes symptoms.    

## 2014-08-28 NOTE — Plan of Care (Signed)
Problem: Phase I Progression Outcomes Goal: Voiding adequately Outcome: Completed/Met Date Met:  08/28/14

## 2014-08-28 NOTE — Plan of Care (Signed)
Problem: Phase II Progression Outcomes Goal: Incision intact & without signs/symptoms of infection Outcome: Completed/Met Date Met:  08/28/14  Problem: Discharge Progression Outcomes Goal: Activity appropriate for discharge plan Outcome: Completed/Met Date Met:  08/28/14 Goal: Tolerating diet Outcome: Completed/Met Date Met:  08/28/14 Goal: Pain controlled with appropriate interventions Requests change to Vicodin for improved pain management, with good results

## 2014-08-28 NOTE — Lactation Note (Signed)
This note was copied from the chart of Ashley Mcgrath. Lactation Consultation Note  Initial visit made.  Breastfeeding consultation services and support information given to patient.  This is mom's third baby to breastfeed.  She states baby has been latching very well since birth and denies pain.  Mom is currently feeding baby.   Latch deep and lips flanged.  Instructed to feed baby with any feeding cue and to call for concerns/latch assist.  Patient Name: Ashley Mcgrath NWGNF'AToday's Date: 08/28/2014 Reason for consult: Initial assessment   Maternal Data    Feeding Feeding Type: Breast Fed  LATCH Score/Interventions                      Lactation Tools Discussed/Used     Consult Status Consult Status: PRN    Huston FoleyMOULDEN, Trendon Zaring S 08/28/2014, 11:54 AM

## 2014-08-28 NOTE — Addendum Note (Signed)
Addendum  created 08/28/14 [id number] by Suella Groveoderick C Sueo Cullen, CRNA   Modules edited: Notes Section   Notes Section:  File: [id number]

## 2014-08-28 NOTE — Anesthesia Postprocedure Evaluation (Signed)
  Anesthesia Post-op Note  Patient: Ashley Mcgrath  Procedure(s) Performed: Procedure(s) with comments: Repeat CESAREAN SECTION WITH BILATERAL TUBAL LIGATION  (Bilateral) - EDD: 09/04/14  Patient Location: Mother/Baby  Anesthesia Type:Spinal  Level of Consciousness: awake, alert , oriented and patient cooperative  Airway and Oxygen Therapy: Patient Spontanous Breathing  Post-op Pain: mild  Post-op Assessment: Post-op Vital signs reviewed, Patient's Cardiovascular Status Stable, Respiratory Function Stable, Patent Airway, No signs of Nausea or vomiting, Adequate PO intake, Pain level controlled, No headache, No backache, No residual numbness and No residual motor weakness  Post-op Vital Signs: Reviewed and stable  Last Vitals:  Filed Vitals:   08/28/14 0637  BP: 111/62  Pulse: 66  Temp: 36.6 C  Resp: 18    Complications: No apparent anesthesia complications

## 2014-08-29 MED ORDER — MAGNESIUM OXIDE 400 (241.3 MG) MG PO TABS
200.0000 mg | ORAL_TABLET | Freq: Two times a day (BID) | ORAL | Status: DC
Start: 2014-08-29 — End: 2014-08-29
  Administered 2014-08-29: 200 mg via ORAL
  Filled 2014-08-29: qty 0.5

## 2014-08-29 MED ORDER — POLYSACCHARIDE IRON COMPLEX 150 MG PO CAPS: 150.0000 mg | ORAL_CAPSULE | Freq: Two times a day (BID) | ORAL | Status: DC

## 2014-08-29 MED ORDER — MAGNESIUM OXIDE 400 (241.3 MG) MG PO TABS: 200.0000 mg | ORAL_TABLET | Freq: Two times a day (BID) | ORAL | Status: DC

## 2014-08-29 MED ORDER — HYDROCODONE-ACETAMINOPHEN 5-325 MG PO TABS: 1.0000 | ORAL_TABLET | ORAL | Status: DC | PRN

## 2014-08-29 MED ORDER — IBUPROFEN 600 MG PO TABS: 600.0000 mg | ORAL_TABLET | Freq: Four times a day (QID) | ORAL | Status: AC

## 2014-08-29 NOTE — Lactation Note (Signed)
This note was copied from the chart of Ashley Mcgrath. Lactation Consultation Note      Brief follow up consult with this experienced breast feeding mom and baby, now 2646 hours old. Mom reports baby doing well, and denies any questions.  Patient Name: Ashley Mcgrath BJYNW'GToday's Date: 08/29/2014 Reason for consult: Follow-up assessment   Maternal Data    Feeding Feeding Type: Breast Fed Length of feed: 20 min  LATCH Score/Interventions Latch: Grasps breast easily, tongue down, lips flanged, rhythmical sucking.  Audible Swallowing: Spontaneous and intermittent  Type of Nipple: Everted at rest and after stimulation  Comfort (Breast/Nipple): Soft / non-tender     Hold (Positioning): No assistance needed to correctly position infant at breast.  LATCH Score: 10  Lactation Tools Discussed/Used     Consult Status Consult Status: Complete    Alfred LevinsLee, Regis Hinton Anne 08/29/2014, 11:46 AM

## 2014-08-29 NOTE — Discharge Summary (Signed)
POSTOPERATIVE DISCHARGE SUMMARY:  Patient ID: Ashley Mcgrath MRN: [id number] DOB/AGE: 34/01/1981 34 y.o.  Admit date: 08/27/2014 Admission Diagnoses: 38.6 weeks / previous CS - desired repeat / GDMa1 / macrosomia / Polyhydramnios / hypothyroidism / undesired fertility  Discharge date:  08/29/2014 Discharge Diagnoses: POD 2 s/p CS with BTL / IDA of pregnancy with compounded ABL anemia  Prenatal history: [id number]   EDC : 09/04/2014, by Other Basis  [facility]  Primary provider : Seymour BarsLavoie Prenatal course complicated by hypothyroidism / hx depression / previous CS / GDMa1 / polyhydramnios / macorsomia  Prenatal Labs: ABO, Rh: --/--/A POS, A POS (11/30 1535)  Antibody: NEG (11/30 1535) Rubella: Immune (05/06 0000)   RPR: NON REAC (11/30 1535)  HBsAg: Negative (05/06 0000)  HIV: Non-reactive (05/06 0000)  GTT : ABN - c/w GDM GBS: Positive (09/23 0000)   Medical / Surgical History :  Past medical history:  Past Medical History  Diagnosis Date  . GERD (gastroesophageal reflux disease)   . Anxiety   . Hypothyroid   . ADHD (attention deficit hyperactivity disorder)   . Depression   . Diabetes mellitus without complication 2015    Gestational DM  . Fibromyalgia   . Complication of anesthesia 2008    during CS after baby delivered...pt states she blacked out.  Marland Kitchen. Postpartum care following repeat cesarean delivery with BTL (12/1) 08/27/2014    Past surgical history:  Past Surgical History  Procedure Laterality Date  . Cesarean section    . Knee surgery x 2    . Cesarean section with bilateral tubal ligation Bilateral 08/27/2014    Procedure: Repeat CESAREAN SECTION WITH BILATERAL TUBAL LIGATION ;  Surgeon: Genia DelMarie-Lyne Lavoie, MD;  Location: [facility];  Service: Obstetrics;  Laterality: Bilateral;  EDD: 09/04/14    Family History:  Family History  Problem Relation Age of Onset  . Heart disease Mother   . Diabetes Father     Social History:  reports  that she quit smoking about 2 years ago. Her smoking use included Cigarettes. She smoked 0.00 packs per day. She does not have any smokeless tobacco history on file. She reports that she drinks alcohol. She reports that she does not use illicit drugs.  Allergies: Review of patient's allergies indicates no known allergies.   Current Medications at time of admission:  Prior to Admission medications   Medication Sig Start Date End Date Taking? Authorizing Provider  Prenatal Vit-Fe Fumarate-FA (PRENATAL MULTIVITAMIN) TABS tablet Take 1 tablet by mouth daily at 12 noon.   Yes Historical Provider, MD  SYNTHROID 75 MCG tablet TAKE 1 TABLET EVERY DAY 08/24/13  Yes Mary-Margaret Daphine DeutscherMartin, FNP    Procedures: Cesarean section delivery on 08/27/2014 with delivery of  female newborn by Dr Seymour BarsLavoie   See operative report for further details APGAR (1 MIN): 9   APGAR (5 MINS): 9    Postoperative / postpartum course:  Uncomplicated with discharge on POD 2  Discharge Instructions:  Discharged Condition: stable  Activity: pelvic rest and postoperative restrictions x 2   Diet: routine  Medications:    Medication List    STOP taking these medications        ALPRAZolam 0.5 MG tablet  Commonly known as:  XANAX     DULoxetine 30 MG capsule  Commonly known as:  CYMBALTA     lisdexamfetamine 40 MG capsule  Commonly known as:  VYVANSE     lisdexamfetamine 50 MG capsule  Commonly known  as:  VYVANSE     valACYclovir 1000 MG tablet  Commonly known as:  VALTREX      TAKE these medications        HYDROcodone-acetaminophen 5-325 MG per tablet  Commonly known as:  NORCO/VICODIN  Take 1-2 tablets by mouth every 4 (four) hours as needed for severe pain.     ibuprofen 600 MG tablet  Commonly known as:  ADVIL,MOTRIN  Take 1 tablet (600 mg total) by mouth every 6 (six) hours.     iron polysaccharides 150 MG capsule  Commonly known as:  NIFEREX  Take 1 capsule (150 mg total) by mouth 2 (two) times  daily. BID x 2 weeks then 1 daily x 4 weeks     magnesium oxide 400 (241.3 MG) MG tablet  Commonly known as:  MAG-OX  Take 0.5 tablets (200 mg total) by mouth 2 (two) times daily. BID x 2 weeks the 1 daily x 4 weeks - take WITH iron     prenatal multivitamin Tabs tablet  Take 1 tablet by mouth daily at 12 noon.     SYNTHROID 75 MCG tablet  Generic drug:  levothyroxine  TAKE 1 TABLET EVERY DAY        Wound Care: keep clean and dry / remove honeycomb POD 5 Postpartum Instructions: [facility] discharge booklet - instructions reviewed  Discharge to: Home  Follow up :   [facility] in 6 weeks for routine postpartum visit with Dr Seymour BarsLavoie 2hr GTT at 6-12 weeks PP                Signed: Marlinda MikeBAILEY, Breanna Mcdaniel CNM, MSN, [facility] 08/29/2014, 11:26 AM

## 2014-08-29 NOTE — Plan of Care (Signed)
Problem: Discharge Progression Outcomes Goal: Complications resolved/controlled Outcome: Not Applicable Date Met:  74/73/40 Goal: Discharge plan in place and appropriate Outcome: Completed/Met Date Met:  08/29/14

## 2014-08-29 NOTE — Plan of Care (Signed)
Problem: Phase II Progression Outcomes Goal: Other Phase II Outcomes/Goals Outcome: Not Applicable Date Met:  02/98/47  Problem: Discharge Progression Outcomes Goal: Barriers To Progression Addressed/Resolved Outcome: Not Applicable Date Met:  30/85/69 Goal: Complications resolved/controlled Outcome: Not Met (add Reason) Goal: Pain controlled with appropriate interventions Outcome: Completed/Met Date Met:  08/29/14 Goal: Afebrile, VS remain stable at discharge Outcome: Completed/Met Date Met:  08/29/14 Goal: Other Discharge Outcomes/Goals Outcome: Not Applicable Date Met:  43/70/05

## 2014-08-29 NOTE — Plan of Care (Signed)
Problem: Discharge Progression Outcomes Goal: Remove staples per MD order Outcome: Not Applicable Date Met:  67/01/10

## 2014-08-29 NOTE — Progress Notes (Signed)
POSTOPERATIVE DAY # 2 S/P repeat C/S and BTL   S:         Reports feeling better, ready to go home today             Worsening gas pains             Tolerating po intake / no nausea / no vomiting / + flatus / + small BM             Bleeding is light             Pain controlled withMotrin and Vicodin              Up ad lib / ambulatory/ voiding QS  Newborn breast feeding - going well   O:  VS: BP 107/65 mmHg  Pulse 70  Temp(Src) 98.2 F (36.8 C) (Oral)  Resp 18  SpO2 99%  Breastfeeding? Unknown   LABS:               Recent Labs  08/26/14 1535 08/27/14 0950 08/28/14 0540  WBC 8.7  --  16.1*  HGB 11.4*  --  8.1*  PLT 138* 143* 123*               Bloodtype: --/--/A POS, A POS (11/30 1535)  Rubella: Immune (05/06 0000)                                             I&O: Intake/Output      12/02 0701 - 12/03 0700 12/03 0701 - 12/04 0700   P.O. 960    I.V. 1552.1    Total Intake 2512.1     Urine 1375    Blood     Total Output 1375     Net +1137.1                       Physical Exam:             Alert and Oriented X3  Lungs: Clear and unlabored  Heart: regular rate and rhythm / no mumurs  Abdomen: soft, non-tender, non-distended, active bowel sounds in all 4 quadrants             Fundus: firm, non-tender, U-2             Dressing: honey comb dressing clean, dry, intact             Incision:  approximated with sutures and Dermabond / no erythema / no ecchymosis / no drainage  Perineum: intact, no ecchymosis, no edema,   Lochia: scant red bleeding, no clots notes  Extremities: non-pitting dependent edema, no calf pain or tenderness, negative Homans bilaterally  A:        POD # 2 S/P Repeat C/S and BTL            GDMA1 - Macrosomia and Polyhydramnios - CBGs stable            Hypothyroidism - on Levothyroxine            IDA with ABL Anemia - on Niferex BID            Depression - stable   P:       Discharge home today            Fasting 2-hour GTT at 6-12 weeks PP  Continue Niferex BID x 2 weeks, then daily x 4 weeks, Recheck CBC at 6 weeks PP visit            Continue Magnesium Oxide with Niferex, continue Prenatal Vitamin            Comfort measures for flatus discomfort - shower, K-pad, warm liquids, warm soups, ambulation             Instructed to D/C honey comb dressing on day 5 (12/6)            Declined Tdap and Flu vaccines  Milinda CaveMeredith Laquinn Shippy, SNM

## 2014-09-06 ENCOUNTER — Encounter (HOSPITAL_COMMUNITY): Payer: Self-pay | Admitting: *Deleted

## 2014-11-12 ENCOUNTER — Other Ambulatory Visit: Payer: Self-pay | Admitting: Nurse Practitioner

## 2015-01-30 ENCOUNTER — Other Ambulatory Visit: Payer: Self-pay | Admitting: Nurse Practitioner

## 2015-07-03 ENCOUNTER — Ambulatory Visit (INDEPENDENT_AMBULATORY_CARE_PROVIDER_SITE_OTHER): Payer: BLUE CROSS/BLUE SHIELD | Admitting: Pediatrics

## 2015-07-03 ENCOUNTER — Encounter: Payer: Self-pay | Admitting: Pediatrics

## 2015-07-03 VITALS — BP 106/69 | HR 73 | Temp 98.1°F | Ht 65.0 in | Wt 147.6 lb

## 2015-07-03 DIAGNOSIS — Z0000 Encounter for general adult medical examination without abnormal findings: Secondary | ICD-10-CM | POA: Diagnosis not present

## 2015-07-03 DIAGNOSIS — E038 Other specified hypothyroidism: Secondary | ICD-10-CM

## 2015-07-03 DIAGNOSIS — D649 Anemia, unspecified: Secondary | ICD-10-CM

## 2015-07-03 DIAGNOSIS — E559 Vitamin D deficiency, unspecified: Secondary | ICD-10-CM

## 2015-07-03 NOTE — Patient Instructions (Signed)
Let me know if spot is changing.

## 2015-07-03 NOTE — Progress Notes (Signed)
    Subjective:    Patient ID: Ashley Mcgrath, female    DOB: 29-Dec-1980, 34 y.o.   MRN: [id number]  CC: CPE  HPI: Ashley Mcgrath is a 34 y.o. female presenting on 07/03/2015 for Annual Exam  Had a goiter at 34yo, then got another after 2nd daughter born. Third child is now 10 mo, breastfeeding. Also 10yo at home.  SAHM. Periods have been on heavy side. Had tube tied.  Had colposcopy 1 mo ago. Has pap smear scheduled for 6 mo.  Mom had cervical or uterine cancer, had a hysterectomy No colon or breast ca. Neuroblastoma in middle daughter, died at age 21. Feels tired most of the time.  Relevant past medical, surgical, family and social history reviewed and updated as indicated. Interim medical history since our last visit reviewed. Allergies and medications reviewed and updated.  ROS: Per HPI unless specifically indicated above  Past Medical History Patient Active Problem List   Diagnosis Date Noted  . Gestational diabetes mellitus, class A1 08/28/2014  . Acute blood loss anemia 08/28/2014  . Hypothyroidism 08/28/2014  . Postoperative state 08/27/2014  . Postpartum care following repeat cesarean delivery with BTL (12/1) 08/27/2014  . Unspecified hypothyroidism 04/12/2013  . ADHD (attention deficit hyperactivity disorder) 04/12/2013    Current Outpatient Prescriptions  Medication Sig Dispense Refill  . ibuprofen (ADVIL,MOTRIN) 600 MG tablet Take 1 tablet (600 mg total) by mouth every 6 (six) hours. 30 tablet 0  . Prenatal Vit-Fe Fumarate-FA (PRENATAL MULTIVITAMIN) TABS tablet Take 1 tablet by mouth daily at 12 noon.    Marland Kitchen SYNTHROID 75 MCG tablet TAKE 1 TABLET EVERY DAY 90 tablet 2   No current facility-administered medications for this visit.       Objective:    BP 106/69 mmHg  Pulse 73  Temp(Src) 98.1 F (36.7 C) (Oral)  Ht  (1.651 m)  Wt 147 lb 9.6 oz (66.951 kg)  BMI 24.56 kg/m2  Wt Readings from Last 3 Encounters:  07/03/15 147 lb 9.6 oz (66.951 kg)  08/26/14  170 lb (77.111 kg)  07/23/13 138 lb (62.596 kg)    Gen: NAD, alert, cooperative with exam, NCAT EYES: EOMI, no scleral injection or icterus ENT:  TMs pearly gray b/l, OP without erythema Neck: symmetric goiter present, no nodules LYMPH: no cervical LAD CV: NRRR, normal S1/S2, no murmur, DP pulses 2+ b/l Resp: CTABL, no wheezes, normal WOB Abd: +BS, soft, NTND. no guarding or organomegaly Ext: No edema, warm Neuro: Alert and oriented Skin: mid L chest below breast with 1cm well-circumscribed hyperpigmented macule, no surrounding erythema, or induration, no scaling. Area feels velvety compared to surrounding skin.      Assessment & Plan:   Ashley Mcgrath was seen today for annual exam, overall doing well. Breastfeeding, mood stable.   Diagnoses and all orders for this visit:  Other specified hypothyroidism Recheck Thyroid Panel With TSH. Has not been on levothyroxine for several weeks. Does have a goiter.  Vitamin D deficiency H/o deficiency, takes daily vit D. Recheck level today as pt has been feeling more fatigued. Little sunlight exposure.       Anemia, unspecified anemia type Anemia following deliveries. Pt now fatigued, heavy periods.     CBC with Differential  Follow up plan: Return in about 1 year (around 07/02/2016).  Rex Kras, MD [facility] 07/03/2015, 12:19 PM

## 2015-07-04 LAB — CBC WITH DIFFERENTIAL/PLATELET
BASOS ABS: 0 10*3/uL (ref 0.0–0.2)
Basos: 0 %
EOS (ABSOLUTE): 0.4 10*3/uL (ref 0.0–0.4)
EOS: 5 %
HEMATOCRIT: 41.4 % (ref 34.0–46.6)
Hemoglobin: 13.4 g/dL (ref 11.1–15.9)
Immature Grans (Abs): 0 10*3/uL (ref 0.0–0.1)
Immature Granulocytes: 0 %
Lymphocytes Absolute: 3.2 10*3/uL — ABNORMAL HIGH (ref 0.7–3.1)
Lymphs: 40 %
MCH: 27.2 pg (ref 26.6–33.0)
MCHC: 32.4 g/dL (ref 31.5–35.7)
MCV: 84 fL (ref 79–97)
MONOS ABS: 0.7 10*3/uL (ref 0.1–0.9)
Monocytes: 9 %
Neutrophils Absolute: 3.6 10*3/uL (ref 1.4–7.0)
Neutrophils: 46 %
Platelets: 290 10*3/uL (ref 150–379)
RBC: 4.92 x10E6/uL (ref 3.77–5.28)
RDW: 14.8 % (ref 12.3–15.4)
WBC: 7.9 10*3/uL (ref 3.4–10.8)

## 2015-07-04 LAB — VITAMIN D 25 HYDROXY (VIT D DEFICIENCY, FRACTURES): Vit D, 25-Hydroxy: 29.9 ng/mL — ABNORMAL LOW (ref 30.0–100.0)

## 2015-07-04 LAB — THYROID PANEL WITH TSH
FREE THYROXINE INDEX: 1.7 (ref 1.2–4.9)
T3 Uptake Ratio: 26 % (ref 24–39)
T4 TOTAL: 6.7 ug/dL (ref 4.5–12.0)
TSH: 2.03 u[IU]/mL (ref 0.450–4.500)

## 2015-07-07 NOTE — Addendum Note (Signed)
Addended by: Johna Sheriff on: 07/07/2015 03:44 PM   Modules accepted: Kipp Brood

## 2015-12-18 ENCOUNTER — Telehealth: Payer: Self-pay | Admitting: Pediatrics

## 2016-01-27 ENCOUNTER — Encounter: Payer: Self-pay | Admitting: Family Medicine

## 2016-01-27 ENCOUNTER — Ambulatory Visit (INDEPENDENT_AMBULATORY_CARE_PROVIDER_SITE_OTHER): Payer: Medicaid Other | Admitting: Family Medicine

## 2016-01-27 VITALS — BP 114/77 | HR 90 | Temp 98.1°F | Ht 65.0 in | Wt 157.2 lb

## 2016-01-27 DIAGNOSIS — F909 Attention-deficit hyperactivity disorder, unspecified type: Secondary | ICD-10-CM

## 2016-01-27 DIAGNOSIS — R5382 Chronic fatigue, unspecified: Secondary | ICD-10-CM

## 2016-01-27 DIAGNOSIS — E039 Hypothyroidism, unspecified: Secondary | ICD-10-CM | POA: Diagnosis not present

## 2016-01-27 MED ORDER — LISDEXAMFETAMINE DIMESYLATE 30 MG PO CAPS: 30.0000 mg | ORAL_CAPSULE | Freq: Every day | ORAL | Status: AC

## 2016-01-27 NOTE — Progress Notes (Addendum)
   HPI  Patient presents today here to restart Vyvanse.  Patient explains that she has a long history of ADHD, diagnosed by psychiatry around 2006.  He states that she stopped taking it for her pregnancy and then to breast-feed the baby. She weaned her baby about one month ago. She breast-fed for 16 months.  She notes difficulty with focus, attention, and completing tasks. She also notes that by [employer] helps her fatigue.  She would like her thyroid rechecked, her fatigue seems to be worse over the last couple of months compared to previously.  We discussed the controlled substance contract and I was clear with her that she will have to do a urine screen in the next 2-3 visits.  She denies using several drugs, however she never admits to using marijuana. But she never did on that either. She did speak very vaguely about being concerned about the urine drug screen. When I asked directly she pointed out that she never said that she uses marijuana. I explained if her urine drug screen is ever positive for marijuana we would no longer be able to prescribe her controlled substances.  PMH: Smoking status noted ROS: Per HPI  Objective: BP 114/77 mmHg  Pulse 90  Temp(Src) 98.1 F (36.7 C) (Oral)  Ht '5\' 5"'$  (1.651 m)  Wt 157 lb 3.2 oz (71.305 kg)  BMI 26.16 kg/m2  LMP 01/23/2016 Gen: NAD, alert, cooperative with exam HEENT: NCAT CV: RRR, good S1/S2, no murmur Resp: CTABL, no wheezes, non-labored Neuro: Alert and oriented, No gross deficits  Assessment and plan:  # ADHD Restarting Vyvanse, 30 mg One month given Reviewed the outline of the controlled substance agreement, will review and sign next visit Plan UDS next visit Controlled substance database was reviewed and is negative for any other substances prescribed.  Hypothyroidism Rechecking labs  Chronic fatigue Seems to be worse lately Checking CBC I believe Vyvanse will also help  Vague discussion around marijuana use, I was  very direct with expectations for her urine drug screen and for avoiding contact with any controlled that is not prescribed. As well as any illegal substance.    Orders Placed This Encounter  Procedures  . TSH  . CBC with Differential  . CMP14+EGFR    Meds ordered this encounter  Medications  . DISCONTD: lisdexamfetamine (VYVANSE) 30 MG capsule    Sig: Take 30 mg by mouth daily.  Marland Kitchen lisdexamfetamine (VYVANSE) 30 MG capsule    Sig: Take 1 capsule (30 mg total) by mouth daily.    Dispense:  30 capsule    Refill:  Bena, MD [facility] 01/27/2016, 3:21 PM

## 2016-01-27 NOTE — Patient Instructions (Signed)
Great to meet you!  Lets see you back in 3-4 weeks to see how the medicine is working for you.   We will call with labs within 1 week

## 2016-01-28 LAB — CMP14+EGFR
A/G RATIO: 1.6 (ref 1.2–2.2)
ALBUMIN: 4.6 g/dL (ref 3.5–5.5)
ALT: 9 IU/L (ref 0–32)
AST: 13 IU/L (ref 0–40)
Alkaline Phosphatase: 75 IU/L (ref 39–117)
BUN / CREAT RATIO: 13 (ref 9–23)
BUN: 10 mg/dL (ref 6–20)
CHLORIDE: 100 mmol/L (ref 96–106)
CO2: 27 mmol/L (ref 18–29)
Calcium: 9.4 mg/dL (ref 8.7–10.2)
Creatinine, Ser: 0.78 mg/dL (ref 0.57–1.00)
GFR calc non Af Amer: 99 mL/min/{1.73_m2} (ref >59)
GFR, EST AFRICAN AMERICAN: 114 mL/min/{1.73_m2} (ref >59)
GLOBULIN, TOTAL: 2.8 g/dL (ref 1.5–4.5)
Glucose: 100 mg/dL — ABNORMAL HIGH (ref 65–99)
POTASSIUM: 4.4 mmol/L (ref 3.5–5.2)
SODIUM: 140 mmol/L (ref 134–144)
TOTAL PROTEIN: 7.4 g/dL (ref 6.0–8.5)

## 2016-01-28 LAB — CBC WITH DIFFERENTIAL/PLATELET
BASOS ABS: 0 10*3/uL (ref 0.0–0.2)
BASOS: 0 %
EOS (ABSOLUTE): 0.4 10*3/uL (ref 0.0–0.4)
Eos: 4 %
HEMOGLOBIN: 13.8 g/dL (ref 11.1–15.9)
Hematocrit: 41.9 % (ref 34.0–46.6)
IMMATURE GRANULOCYTES: 0 %
Immature Grans (Abs): 0 10*3/uL (ref 0.0–0.1)
LYMPHS: 39 %
Lymphocytes Absolute: 3.8 10*3/uL — ABNORMAL HIGH (ref 0.7–3.1)
MCH: 28 pg (ref 26.6–33.0)
MCHC: 32.9 g/dL (ref 31.5–35.7)
MCV: 85 fL (ref 79–97)
Monocytes Absolute: 0.8 10*3/uL (ref 0.1–0.9)
Monocytes: 9 %
NEUTROS PCT: 48 %
Neutrophils Absolute: 4.7 10*3/uL (ref 1.4–7.0)
PLATELETS: 311 10*3/uL (ref 150–379)
RBC: 4.92 x10E6/uL (ref 3.77–5.28)
RDW: 14.6 % (ref 12.3–15.4)
WBC: 9.7 10*3/uL (ref 3.4–10.8)

## 2016-01-28 LAB — TSH: TSH: 1.96 u[IU]/mL (ref 0.450–4.500)

## 2016-02-20 ENCOUNTER — Telehealth: Payer: Self-pay | Admitting: Family Medicine

## 2016-02-20 NOTE — Telephone Encounter (Signed)
Called patient to let her know her office note was ready to pickup

## 2019-05-14 ENCOUNTER — Emergency Department (HOSPITAL_COMMUNITY)
Admission: EM | Admit: 2019-05-14 | Discharge: 2019-05-15 | Disposition: A | Discharge status: Home / Self Care | Payer: Self-pay | Attending: Emergency Medicine | Admitting: Emergency Medicine

## 2019-05-14 ENCOUNTER — Encounter (HOSPITAL_COMMUNITY): Payer: Self-pay | Admitting: Emergency Medicine

## 2019-05-14 ENCOUNTER — Emergency Department (HOSPITAL_COMMUNITY): Payer: Self-pay

## 2019-05-14 DIAGNOSIS — R51 Headache: Secondary | ICD-10-CM | POA: Insufficient documentation

## 2019-05-14 DIAGNOSIS — Z5321 Procedure and treatment not carried out due to patient leaving prior to being seen by health care provider: Secondary | ICD-10-CM | POA: Insufficient documentation

## 2019-05-14 LAB — BASIC METABOLIC PANEL
Anion gap: 9 (ref 5–15)
BUN: 12 mg/dL (ref 6–20)
CO2: 26 mmol/L (ref 22–32)
Calcium: 9.4 mg/dL (ref 8.9–10.3)
Chloride: 103 mmol/L (ref 98–111)
Creatinine, Ser: 0.83 mg/dL (ref 0.44–1.00)
GFR calc Af Amer: >60 mL/min (ref >60)
GFR calc non Af Amer: >60 mL/min (ref >60)
Glucose, Bld: 97 mg/dL (ref 70–99)
Potassium: 4 mmol/L (ref 3.5–5.1)
Sodium: 138 mmol/L (ref 135–145)

## 2019-05-14 LAB — URINALYSIS, ROUTINE W REFLEX MICROSCOPIC
Bilirubin Urine: NEGATIVE
Glucose, UA: NEGATIVE mg/dL
Hgb urine dipstick: NEGATIVE
Ketones, ur: NEGATIVE mg/dL
Leukocytes,Ua: NEGATIVE
Nitrite: NEGATIVE
Protein, ur: NEGATIVE mg/dL
Specific Gravity, Urine: 1.017 (ref 1.005–1.030)
pH: 5 (ref 5.0–8.0)

## 2019-05-14 LAB — CBC
HCT: 39.7 % (ref 36.0–46.0)
Hemoglobin: 12.7 g/dL (ref 12.0–15.0)
MCH: 27.5 pg (ref 26.0–34.0)
MCHC: 32 g/dL (ref 30.0–36.0)
MCV: 85.9 fL (ref 80.0–100.0)
Platelets: 251 10*3/uL (ref 150–400)
RBC: 4.62 MIL/uL (ref 3.87–5.11)
RDW: 14.5 % (ref 11.5–15.5)
WBC: 7.5 10*3/uL (ref 4.0–10.5)
nRBC: 0 % (ref 0.0–0.2)

## 2019-05-14 LAB — I-STAT BETA HCG BLOOD, ED (MC, WL, AP ONLY): I-stat hCG, quantitative: <5 m[IU]/mL (ref <5)

## 2019-05-14 MED ORDER — SODIUM CHLORIDE 0.9% FLUSH: 3.0000 mL | Freq: Once | INTRAVENOUS | Status: DC

## 2019-05-14 NOTE — ED Triage Notes (Signed)
Pt c/o severe head and neck pain x 2 weeks, pt reports pain worse tonight unrelieved by any treatments. Denies blurred vision, no neuro deficits, no dizziness. Pt reports trash can in the room looked "warped" while waiting.

## 2020-08-04 ENCOUNTER — Other Ambulatory Visit: Payer: Self-pay

## 2020-08-04 ENCOUNTER — Encounter (HOSPITAL_COMMUNITY): Payer: Self-pay | Admitting: Emergency Medicine

## 2020-08-04 ENCOUNTER — Emergency Department (HOSPITAL_COMMUNITY): Payer: 59

## 2020-08-04 ENCOUNTER — Emergency Department (HOSPITAL_COMMUNITY)
Admission: EM | Admit: 2020-08-04 | Discharge: 2020-08-04 | Disposition: A | Discharge status: Home / Self Care | Payer: 59 | Attending: Emergency Medicine | Admitting: Emergency Medicine

## 2020-08-04 DIAGNOSIS — Z87891 Personal history of nicotine dependence: Secondary | ICD-10-CM | POA: Insufficient documentation

## 2020-08-04 DIAGNOSIS — Z79899 Other long term (current) drug therapy: Secondary | ICD-10-CM | POA: Diagnosis not present

## 2020-08-04 DIAGNOSIS — K8050 Calculus of bile duct without cholangitis or cholecystitis without obstruction: Secondary | ICD-10-CM

## 2020-08-04 DIAGNOSIS — R1010 Upper abdominal pain, unspecified: Secondary | ICD-10-CM | POA: Diagnosis present

## 2020-08-04 DIAGNOSIS — R21 Rash and other nonspecific skin eruption: Secondary | ICD-10-CM | POA: Insufficient documentation

## 2020-08-04 DIAGNOSIS — E039 Hypothyroidism, unspecified: Secondary | ICD-10-CM | POA: Diagnosis not present

## 2020-08-04 DIAGNOSIS — K8020 Calculus of gallbladder without cholecystitis without obstruction: Secondary | ICD-10-CM | POA: Insufficient documentation

## 2020-08-04 LAB — CBC WITH DIFFERENTIAL/PLATELET
Abs Immature Granulocytes: 0.02 10*3/uL (ref 0.00–0.07)
Basophils Absolute: 0 10*3/uL (ref 0.0–0.1)
Basophils Relative: 1 %
Eosinophils Absolute: 0.3 10*3/uL (ref 0.0–0.5)
Eosinophils Relative: 4 %
HCT: 40.2 % (ref 36.0–46.0)
Hemoglobin: 13 g/dL (ref 12.0–15.0)
Immature Granulocytes: 0 %
Lymphocytes Relative: 39 %
Lymphs Abs: 3.2 10*3/uL (ref 0.7–4.0)
MCH: 27.4 pg (ref 26.0–34.0)
MCHC: 32.3 g/dL (ref 30.0–36.0)
MCV: 84.6 fL (ref 80.0–100.0)
Monocytes Absolute: 0.7 10*3/uL (ref 0.1–1.0)
Monocytes Relative: 8 %
Neutro Abs: 4.1 10*3/uL (ref 1.7–7.7)
Neutrophils Relative %: 48 %
Platelets: 270 10*3/uL (ref 150–400)
RBC: 4.75 MIL/uL (ref 3.87–5.11)
RDW: 13.8 % (ref 11.5–15.5)
WBC: 8.3 10*3/uL (ref 4.0–10.5)
nRBC: 0 % (ref 0.0–0.2)

## 2020-08-04 LAB — COMPREHENSIVE METABOLIC PANEL
ALT: 15 U/L (ref 0–44)
AST: 18 U/L (ref 15–41)
Albumin: 4 g/dL (ref 3.5–5.0)
Alkaline Phosphatase: 51 U/L (ref 38–126)
Anion gap: 8 (ref 5–15)
BUN: 14 mg/dL (ref 6–20)
CO2: 26 mmol/L (ref 22–32)
Calcium: 8.7 mg/dL — ABNORMAL LOW (ref 8.9–10.3)
Chloride: 102 mmol/L (ref 98–111)
Creatinine, Ser: 0.69 mg/dL (ref 0.44–1.00)
GFR, Estimated: >60 mL/min (ref >60)
Glucose, Bld: 113 mg/dL — ABNORMAL HIGH (ref 70–99)
Potassium: 3.8 mmol/L (ref 3.5–5.1)
Sodium: 136 mmol/L (ref 135–145)
Total Bilirubin: 0.4 mg/dL (ref 0.3–1.2)
Total Protein: 7.1 g/dL (ref 6.5–8.1)

## 2020-08-04 LAB — URINALYSIS, ROUTINE W REFLEX MICROSCOPIC
Bilirubin Urine: NEGATIVE
Glucose, UA: NEGATIVE mg/dL
Hgb urine dipstick: NEGATIVE
Ketones, ur: NEGATIVE mg/dL
Leukocytes,Ua: NEGATIVE
Nitrite: NEGATIVE
Protein, ur: NEGATIVE mg/dL
Specific Gravity, Urine: 1.008 (ref 1.005–1.030)
pH: 6 (ref 5.0–8.0)

## 2020-08-04 LAB — LIPASE, BLOOD: Lipase: 37 U/L (ref 11–51)

## 2020-08-04 LAB — POC URINE PREG, ED: Preg Test, Ur: NEGATIVE

## 2020-08-04 MED ORDER — HYDROCODONE-ACETAMINOPHEN 5-325 MG PO TABS: 1.0000 | ORAL_TABLET | Freq: Four times a day (QID) | ORAL | 0 refills | Status: AC | PRN

## 2020-08-04 MED ORDER — ONDANSETRON HCL 4 MG/2ML IJ SOLN
4.0000 mg | Freq: Once | INTRAMUSCULAR | Status: AC
  Administered 2020-08-04: 4 mg via INTRAVENOUS
  Filled 2020-08-04: qty 2

## 2020-08-04 MED ORDER — IOHEXOL 300 MG/ML  SOLN
100.0000 mL | Freq: Once | INTRAMUSCULAR | Status: AC | PRN
  Administered 2020-08-04: 100 mL via INTRAVENOUS

## 2020-08-04 MED ORDER — ONDANSETRON HCL 4 MG PO TABS: 4.0000 mg | ORAL_TABLET | Freq: Three times a day (TID) | ORAL | 0 refills | Status: AC | PRN

## 2020-08-04 MED ORDER — SODIUM CHLORIDE 0.9 % IV BOLUS
1000.0000 mL | Freq: Once | INTRAVENOUS | Status: AC
  Administered 2020-08-04: 1000 mL via INTRAVENOUS

## 2020-08-04 MED ORDER — FENTANYL CITRATE (PF) 100 MCG/2ML IJ SOLN
50.0000 ug | Freq: Once | INTRAMUSCULAR | Status: AC
  Administered 2020-08-04: 50 ug via INTRAVENOUS
  Filled 2020-08-04: qty 2

## 2020-08-04 NOTE — Discharge Instructions (Addendum)
Avoid fried, spicy, or greasy foods.  Try to follow a low-fat diet.  Please call Dr. Lovell Sheehan office to have a discussion about having your gallbladder removed.  Return to the emergency department if your pain returns and is severe, you have fever, or uncontrolled vomiting.

## 2020-08-04 NOTE — ED Provider Notes (Signed)
[facility] EMERGENCY DEPARTMENT Provider Note   CSN: [id number] Arrival date & time: 08/04/20  0351   Time seen 4:34 AM  History Chief Complaint  Patient presents with  . Abdominal Pain    Ashley Mcgrath is a 39 y.o. female.  HPI Patient states tonight around 7 PM either right before or right after eating fried tenderloin with mushroom gravy she started getting upper abdominal pain that radiates into her back.  She states the worst part of the pain is in her epigastric area.  She states the pain is constant and sharp in the front and the back feels like she has been punched.  She denies nausea, vomiting, diarrhea or fever.  She states her abdomen has felt bloated.  She states she had a similar episode about a month ago however it only lasted few hours and she was able to go to sleep and it was gone.  PCP Hemberg, Ruby ColaKatherine V, NP     Past Medical History:  Diagnosis Date  . ADHD (attention deficit hyperactivity disorder)   . Anxiety   . Complication of anesthesia 2008   during CS after baby delivered...pt states she blacked out.  . Depression   . Diabetes mellitus without complication (HCC) 2015   Gestational DM  . Fibromyalgia   . GERD (gastroesophageal reflux disease)   . Hypothyroid   . Postpartum care following repeat cesarean delivery with BTL (12/1) 08/27/2014    Patient Active Problem List   Diagnosis Date Noted  . Gestational diabetes mellitus, class A1 08/28/2014  . Acute blood loss anemia 08/28/2014  . Hypothyroidism 08/28/2014  . Postoperative state 08/27/2014  . Postpartum care following repeat cesarean delivery with BTL (12/1) 08/27/2014  . ADHD (attention deficit hyperactivity disorder) 04/12/2013    Past Surgical History:  Procedure Laterality Date  . CESAREAN SECTION    . CESAREAN SECTION WITH BILATERAL TUBAL LIGATION Bilateral 08/27/2014   Procedure: Repeat CESAREAN SECTION WITH BILATERAL TUBAL LIGATION ;  Surgeon: Genia DelMarie-Lyne Lavoie, MD;  Location: [facility];  Service: Obstetrics;  Laterality: Bilateral;  EDD: 09/04/14  . KNEE SURGERY X 2       OB History    Gravida  5   Para  3   Term  3   Preterm      AB  2   Living  1     SAB  2   TAB      Ectopic      Multiple  0   Live Births  1           Family History  Problem Relation Age of Onset  . Heart disease Mother   . Diabetes Father     Social History   Tobacco Use  . Smoking status: Former Smoker    Types: Cigarettes    Quit date: 12/27/2011    Years since quitting: 8.6  . Smokeless tobacco: Never Used  Substance Use Topics  . Alcohol use: Yes    Comment: rare  . Drug use: No  employed in real estate  Home Medications Prior to Admission medications   Medication Sig Start Date End Date Taking? Authorizing Provider  HYDROcodone-acetaminophen (NORCO/VICODIN) 5-325 MG tablet Take 1 tablet by mouth every 6 (six) hours as needed for moderate pain or severe pain. 08/04/20   Devoria AlbeKnapp, Keyosha Tiedt, MD  ibuprofen (ADVIL,MOTRIN) 600 MG tablet Take 1 tablet (600 mg total) by mouth every 6 (six) hours. 08/29/14   Marlinda Mikeanya, Bailey., CNM  lisdexamfetamine (VYVANSE)  30 MG capsule Take 1 capsule (30 mg total) by mouth daily. 01/27/16   Elenora Gamma, MD  ondansetron (ZOFRAN) 4 MG tablet Take 1 tablet (4 mg total) by mouth every 8 (eight) hours as needed. 08/04/20   Devoria Albe, MD  SYNTHROID 75 MCG tablet TAKE 1 TABLET EVERY DAY 08/24/13   Bennie Pierini, FNP  Denies any meds  Allergies    Patient has no known allergies.  Review of Systems   Review of Systems  All other systems reviewed and are negative.   Physical Exam Updated Vital Signs BP 93/62 (BP Location: Right Arm)   Pulse 64   Temp 98 F (36.7 C)   Resp 12   Ht 5\' 5"  (1.651 m)   Wt 70.3 kg   LMP 08/01/2020   SpO2 100%   BMI 25.79 kg/m   Physical Exam Vitals and nursing note reviewed.  Constitutional:      General: She is not in acute distress.    Appearance: Normal appearance. She is not  ill-appearing or toxic-appearing.  HENT:     Head: Normocephalic and atraumatic.     Right Ear: External ear normal.     Left Ear: External ear normal.     Mouth/Throat:     Mouth: Mucous membranes are dry.     Pharynx: No oropharyngeal exudate or posterior oropharyngeal erythema.  Eyes:     Extraocular Movements: Extraocular movements intact.     Conjunctiva/sclera: Conjunctivae normal.     Pupils: Pupils are equal, round, and reactive to light.  Cardiovascular:     Rate and Rhythm: Normal rate and regular rhythm.     Pulses: Normal pulses.     Heart sounds: Normal heart sounds.  Pulmonary:     Effort: Pulmonary effort is normal.     Breath sounds: Normal breath sounds.  Abdominal:     General: Bowel sounds are normal. There is distension.     Palpations: Abdomen is soft.     Tenderness: There is abdominal tenderness. There is no guarding or rebound.  Musculoskeletal:        General: Normal range of motion.     Cervical back: Normal range of motion.  Skin:    General: Skin is warm and dry.     Findings: Rash present.  Neurological:     General: No focal deficit present.     Mental Status: She is alert and oriented to person, place, and time.     Cranial Nerves: No cranial nerve deficit.  Psychiatric:        Mood and Affect: Mood normal.        Behavior: Behavior normal.        Thought Content: Thought content normal.     ED Results / Procedures / Treatments   Labs (all labs ordered are listed, but only abnormal results are displayed) Results for orders placed or performed during the hospital encounter of 08/04/20  Comprehensive metabolic panel  Result Value Ref Range   Sodium 136 135 - 145 mmol/L   Potassium 3.8 3.5 - 5.1 mmol/L   Chloride 102 98 - 111 mmol/L   CO2 26 22 - 32 mmol/L   Glucose, Bld 113 (H) 70 - 99 mg/dL   BUN 14 6 - 20 mg/dL   Creatinine, Ser 13/08/21 0.44 - 1.00 mg/dL   Calcium 8.7 (L) 8.9 - 10.3 mg/dL   Total Protein 7.1 6.5 - 8.1 g/dL   Albumin  4.0 3.5 - 5.0 g/dL  AST 18 15 - 41 U/L   ALT 15 0 - 44 U/L   Alkaline Phosphatase 51 38 - 126 U/L   Total Bilirubin 0.4 0.3 - 1.2 mg/dL   GFR, Estimated >34 >19 mL/min   Anion gap 8 5 - 15  Lipase, blood  Result Value Ref Range   Lipase 37 11 - 51 U/L  CBC with Differential  Result Value Ref Range   WBC 8.3 4.0 - 10.5 K/uL   RBC 4.75 3.87 - 5.11 MIL/uL   Hemoglobin 13.0 12.0 - 15.0 g/dL   HCT 62.2 36 - 46 %   MCV 84.6 80.0 - 100.0 fL   MCH 27.4 26.0 - 34.0 pg   MCHC 32.3 30.0 - 36.0 g/dL   RDW 29.7 98.9 - 21.1 %   Platelets 270 150 - 400 K/uL   nRBC 0.0 0.0 - 0.2 %   Neutrophils Relative % 48 %   Neutro Abs 4.1 1.7 - 7.7 K/uL   Lymphocytes Relative 39 %   Lymphs Abs 3.2 0.7 - 4.0 K/uL   Monocytes Relative 8 %   Monocytes Absolute 0.7 0.1 - 1.0 K/uL   Eosinophils Relative 4 %   Eosinophils Absolute 0.3 0.0 - 0.5 K/uL   Basophils Relative 1 %   Basophils Absolute 0.0 0.0 - 0.1 K/uL   Immature Granulocytes 0 %   Abs Immature Granulocytes 0.02 0.00 - 0.07 K/uL  Urinalysis, Routine w reflex microscopic Urine, Clean Catch  Result Value Ref Range   Color, Urine STRAW (A) YELLOW   APPearance CLEAR CLEAR   Specific Gravity, Urine 1.008 1.005 - 1.030   pH 6.0 5.0 - 8.0   Glucose, UA NEGATIVE NEGATIVE mg/dL   Hgb urine dipstick NEGATIVE NEGATIVE   Bilirubin Urine NEGATIVE NEGATIVE   Ketones, ur NEGATIVE NEGATIVE mg/dL   Protein, ur NEGATIVE NEGATIVE mg/dL   Nitrite NEGATIVE NEGATIVE   Leukocytes,Ua NEGATIVE NEGATIVE  POC Urine Pregnancy, ED (not at [facility])  Result Value Ref Range   Preg Test, Ur NEGATIVE NEGATIVE   Laboratory interpretation all normal     EKG None  Radiology CT Abdomen Pelvis W Contrast  Result Date: 08/04/2020 CLINICAL DATA:  Abdominal pain, acute. EXAM: CT ABDOMEN AND PELVIS WITH CONTRAST TECHNIQUE: Multidetector CT imaging of the abdomen and pelvis was performed using the standard protocol following bolus administration of intravenous contrast.  CONTRAST:  OMNIPAQUE IOHEXOL 300 MG/ML  SOLN COMPARISON:  None. FINDINGS: Lower chest:  No contributory findings. Hepatobiliary: No focal liver abnormality.Cholelithiasis. No bile duct dilatation or visible calcified stone. Pancreas: Unremarkable. Spleen: Unremarkable. Adrenals/Urinary Tract: Negative adrenals. No hydronephrosis or stone. Unremarkable bladder. Stomach/Bowel:  No obstruction. No appendicitis. Vascular/Lymphatic: No acute vascular abnormality. No mass or adenopathy. Reproductive:No pathologic findings.  A tampon is present. Other: No ascites or pneumoperitoneum. Musculoskeletal: No acute abnormalities. IMPRESSION: 1. No acute finding. 2. Cholelithiasis. Electronically Signed   By: Marnee Spring M.D.   On: 08/04/2020 06:00    Procedures Procedures (including critical care time)  Medications Ordered in ED Medications  sodium chloride 0.9 % bolus 1,000 mL (0 mLs Intravenous Stopped 08/04/20 0605)  fentaNYL (SUBLIMAZE) injection 50 mcg (50 mcg Intravenous Given 08/04/20 0456)  ondansetron (ZOFRAN) injection 4 mg (4 mg Intravenous Given 08/04/20 0456)  iohexol (OMNIPAQUE) 300 MG/ML solution 100 mL (100 mLs Intravenous Contrast Given 08/04/20 0539)    ED Course  I have reviewed the triage vital signs and the nursing notes.  Pertinent labs & imaging results that were  available during my care of the patient were reviewed by me and considered in my medical decision making (see chart for details).    MDM Rules/Calculators/A&P                         Patient was given IV fluids and IV pain and nausea medication.  We discussed ultrasound is not available at night which is the best test to look for gallstones however CT scan would also show if there were some other problem going on such as inflammation of the intestines.  6:25 AM I talked to the patient about her lab tests and CT result.  We also discussed that ultrasound is a better test for the gallbladder.  She was given the option  of staying in the ED and getting the ultrasound from the ED, they should be here soon for their day shift.  Or she could come back as an outpatient and she has decided to stay in the ED.  I suspect patient will be able to be discharged home, her labs are all normal, she is afebrile, she is not having vomiting.  Patient turned over to Dr. Pilar Plate at change of shift to check her ultrasound results.   Final Clinical Impression(s) / ED Diagnoses Final diagnoses:  Upper abdominal pain  Gallstones    Rx / DC Orders ED Discharge Orders         Ordered    HYDROcodone-acetaminophen (NORCO/VICODIN) 5-325 MG tablet  Every 6 hours PRN        08/04/20 0641    ondansetron (ZOFRAN) 4 MG tablet  Every 8 hours PRN        08/04/20 0641          Disposition pending  Devoria Albe, MD, Concha Pyo, MD 08/04/20 [phone]

## 2020-08-04 NOTE — ED Notes (Signed)
PT resting on stretcher with rails up x 2 call light in hand. Pt remains A/O x 4 skin warm dry intact. Pt denies needs at this time. VSS NAD PT IV healthy with clean dry dressing. PT continues on cardiac monitor and room air.

## 2020-08-04 NOTE — ED Triage Notes (Signed)
Pt with c/o upper-mid abdominal pain that "radiates to her back" and started around 1900 yesterday. Last BM yesterday morning. No difficulties or burning with urination. Tried at-home remedy of vinegar water for possible gallbladder attack.

## 2020-08-04 NOTE — ED Provider Notes (Signed)
  Provider Note MRN:  [id number]  Arrival date & time: 08/04/20    ED Course and Medical Decision Making  Assumed care from Dr. Lynelle Doctor at shift change.  Ultrasound is without cholecystitis, confirmed cholelithiasis.  Appropriate for discharge with general surgery follow-up.  Procedures  Final Clinical Impressions(s) / ED Diagnoses     ICD-10-CM   1. Upper abdominal pain  R10.10   2. Gallstones  K80.20   3. Biliary colic  K80.50     ED Discharge Orders         Ordered    HYDROcodone-acetaminophen (NORCO/VICODIN) 5-325 MG tablet  Every 6 hours PRN        08/04/20 0641    ondansetron (ZOFRAN) 4 MG tablet  Every 8 hours PRN        08/04/20 0641            Discharge Instructions     Avoid fried, spicy, or greasy foods.  Try to follow a low-fat diet.  Please call Dr. Lovell Sheehan office to have a discussion about having your gallbladder removed.  Return to the emergency department if your pain returns and is severe, you have fever, or uncontrolled vomiting.    Elmer Sow. Pilar Plate, MD [facility] Emergency Medicine [facility] [email or web address]    Sabas Sous, MD 08/04/20 [phone]

## 2021-07-20 IMAGING — CT CT ABD-PELV W/ CM
2 of 4 series · 17 of 46 positions shown, 19 images · IV contrast (omnipaque)
Comparison: None.

CLINICAL DATA: Abdominal pain, acute.

EXAM:
CT ABDOMEN AND PELVIS WITH CONTRAST
TECHNIQUE: Multidetector CT imaging of the abdomen and pelvis was performed
using the standard protocol following bolus administration of
intravenous contrast.
CONTRAST:  100mL OMNIPAQUE IOHEXOL 300 MG/ML  SOLN

[Series 2: axial st · axial · 0.69mm/px · z∈[+651,+1051]mm · 14 of 88 slices shown, 16 images]
[im 4/88  soft-tissue]
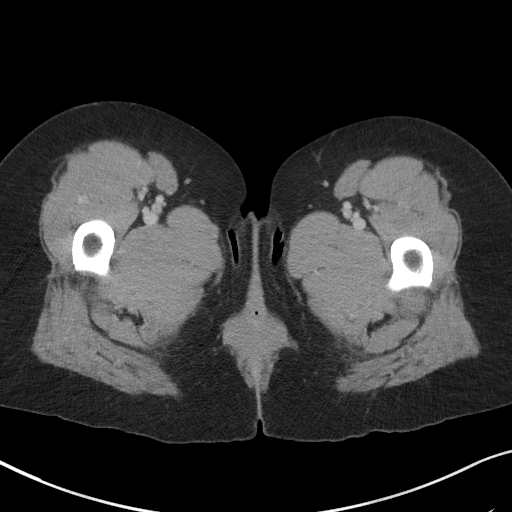
[im 4/88  bone]
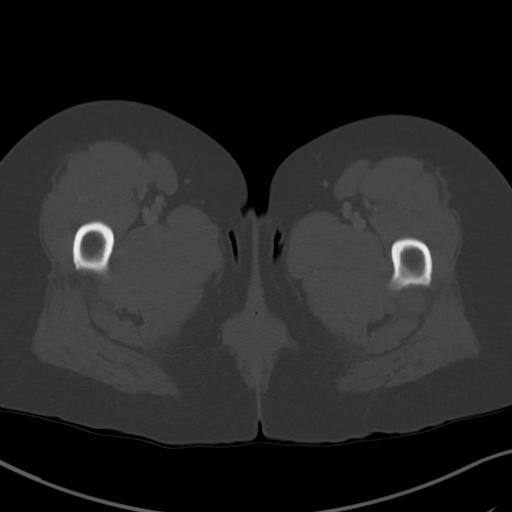
[im 11/88  soft-tissue]
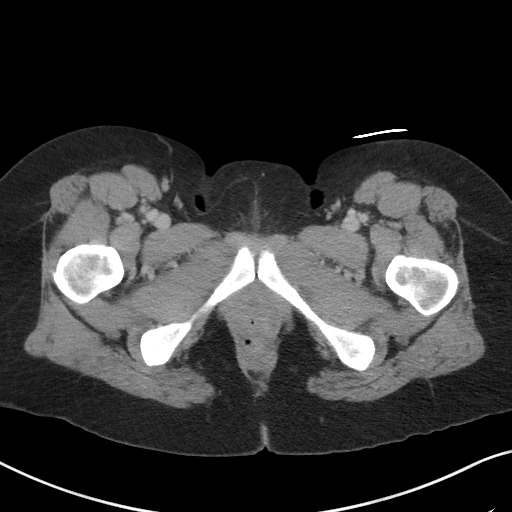
[im 18/88  soft-tissue]
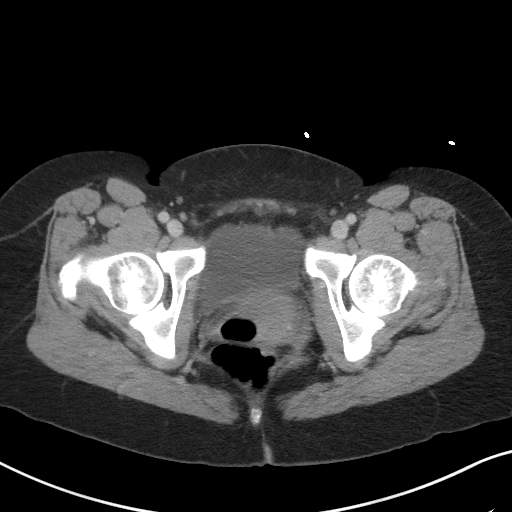
[im 25/88  soft-tissue]
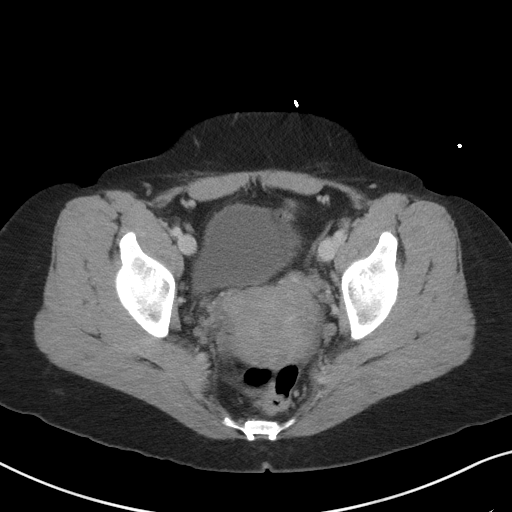
[im 28/88  soft-tissue]
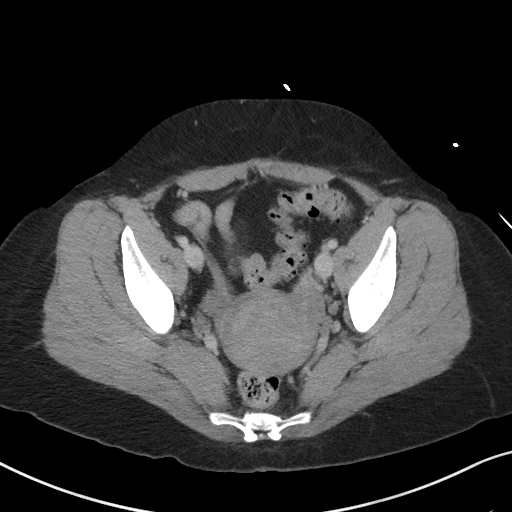
[im 35/88  soft-tissue]
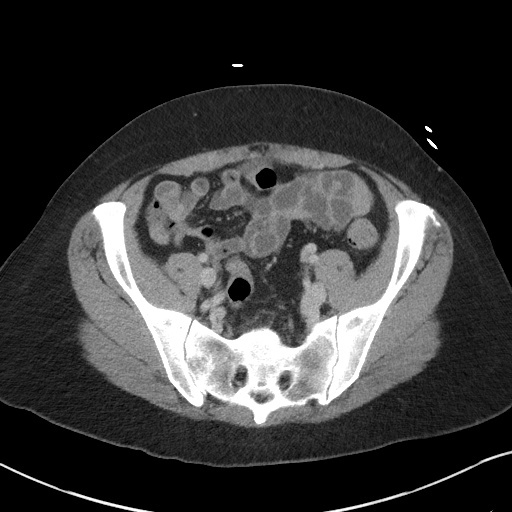
[im 42/88  soft-tissue]
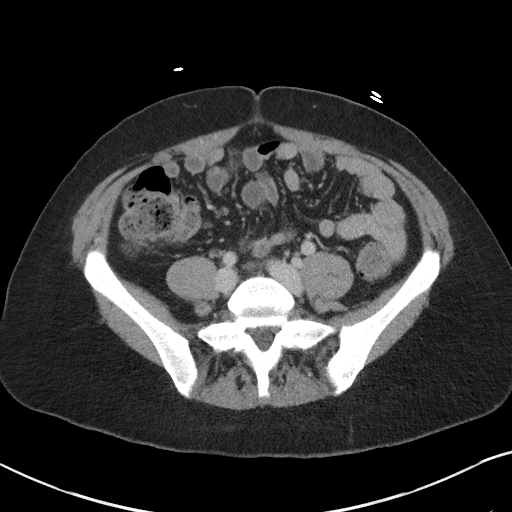
[im 46/88  soft-tissue]
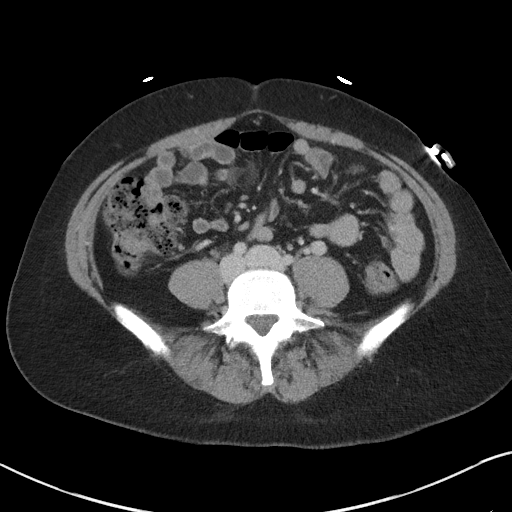
[im 53/88  soft-tissue]
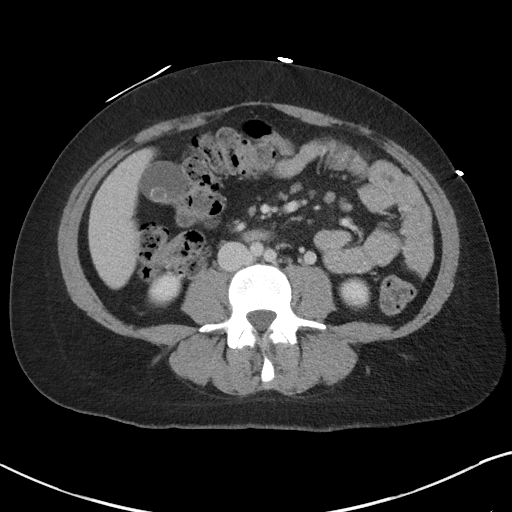
[im 53/88  bone]
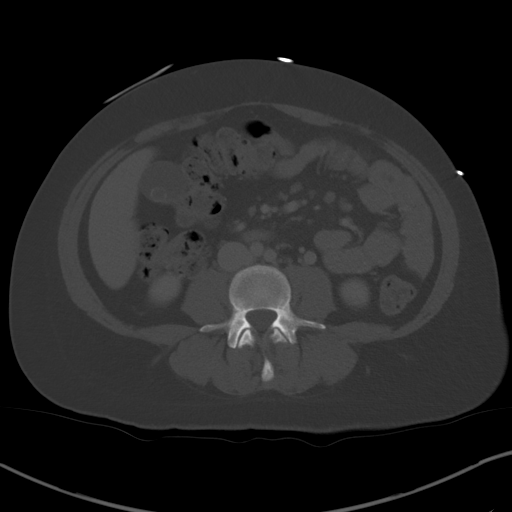
[im 60/88  soft-tissue]
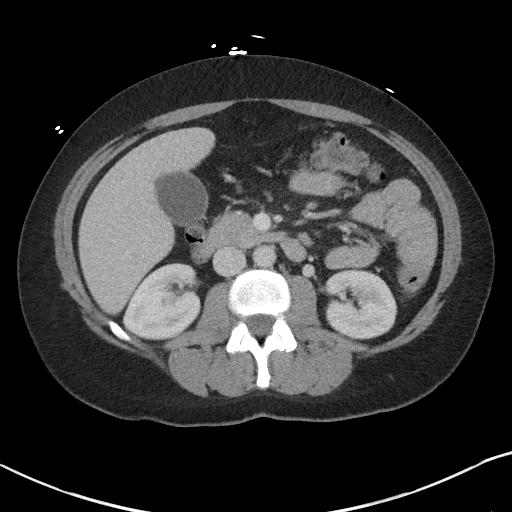
[im 67/88  soft-tissue]
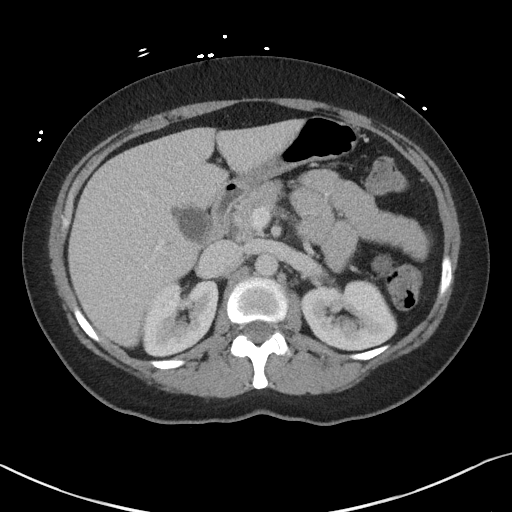
[im 70/88  soft-tissue]
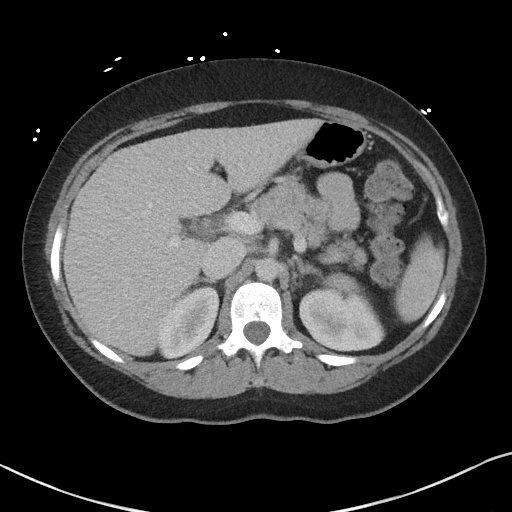
[im 77/88  soft-tissue]
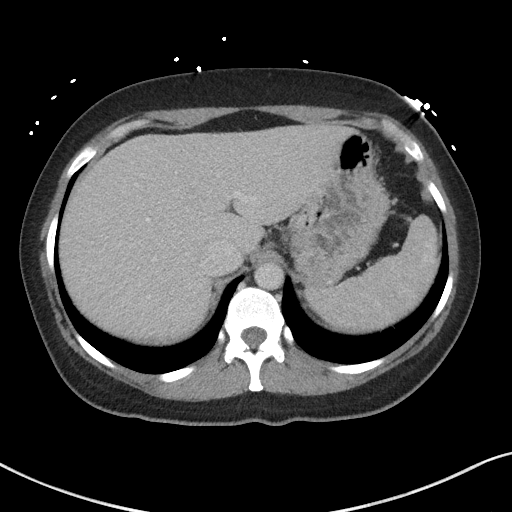
[im 84/88  soft-tissue]
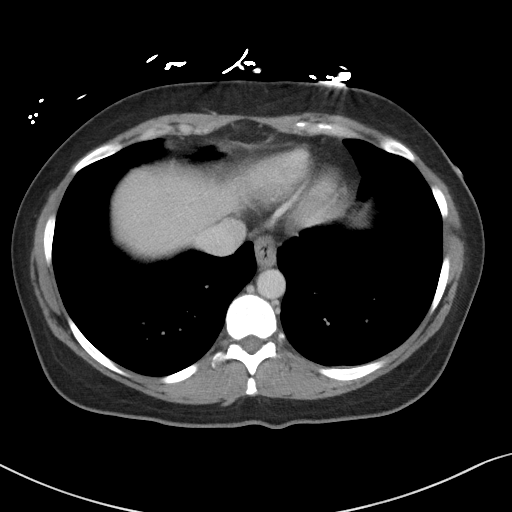

[Series 5: coronal st · coronal · 0.74mm/px · 3 of 103 slices shown]
[im 35/103  soft-tissue]
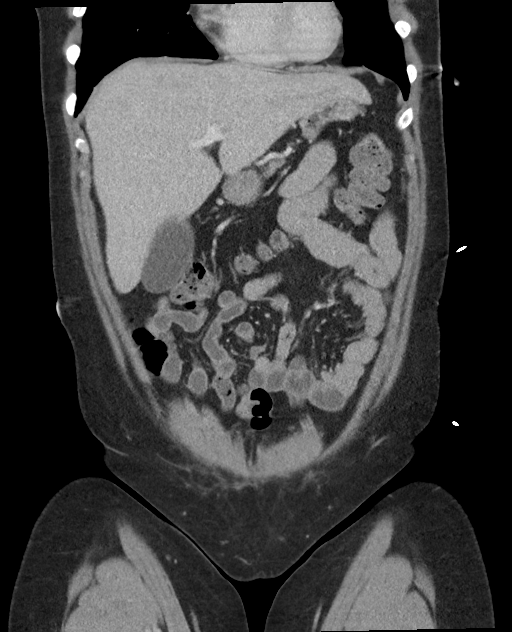
[im 46/103  soft-tissue]
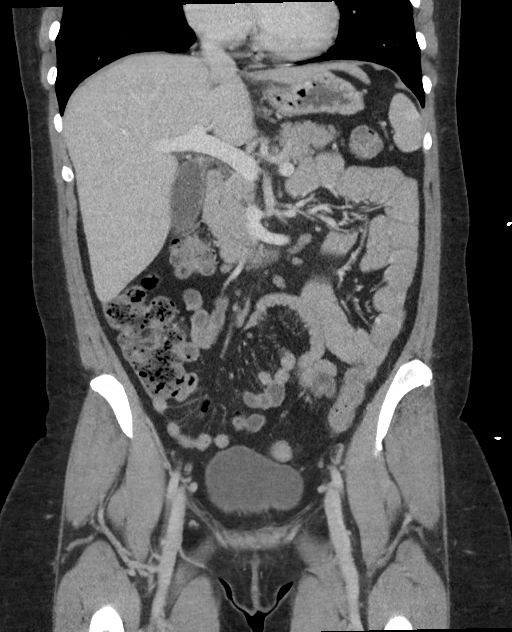
[im 57/103  soft-tissue]
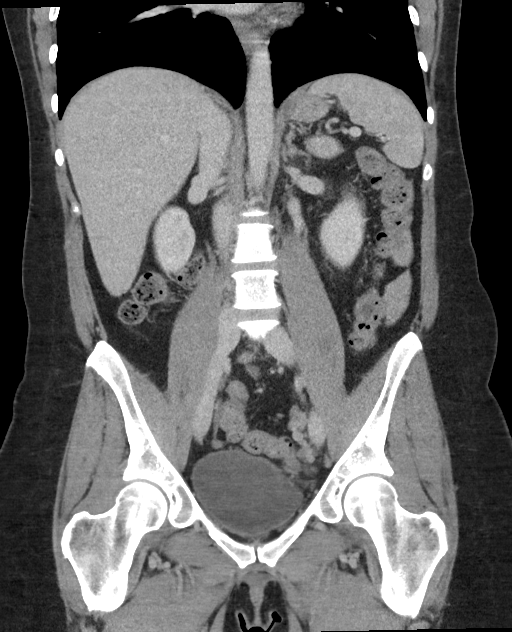

[17 of 46 positions shown; findings below may reference images not displayed]

FINDINGS: Lower chest:  No contributory findings.

Hepatobiliary: No focal liver abnormality.Cholelithiasis. No bile
duct dilatation or visible calcified stone.

Pancreas: Unremarkable.

Spleen: Unremarkable.

Adrenals/Urinary Tract: Negative adrenals. No hydronephrosis or
stone. Unremarkable bladder.

Stomach/Bowel:  No obstruction. No appendicitis.

Vascular/Lymphatic: No acute vascular abnormality. No mass or
adenopathy.

Reproductive:No pathologic findings.  A tampon is present.

Other: No ascites or pneumoperitoneum.

Musculoskeletal: No acute abnormalities.
IMPRESSION: 1. No acute finding.
2. Cholelithiasis.

## 2021-07-20 IMAGING — US US ABDOMEN LIMITED
1 series · 14 of 25 positions shown · non-contrast
Comparison: CT of same day.  November 09, 2011.

CLINICAL DATA: Right upper quadrant abdominal pain. Cholelithiasis.

EXAM:
ULTRASOUND ABDOMEN LIMITED RIGHT UPPER QUADRANT

[Series 1: us abdomen limited · 0.19mm/px · 14 of 109 slices shown]
[im 1/109]
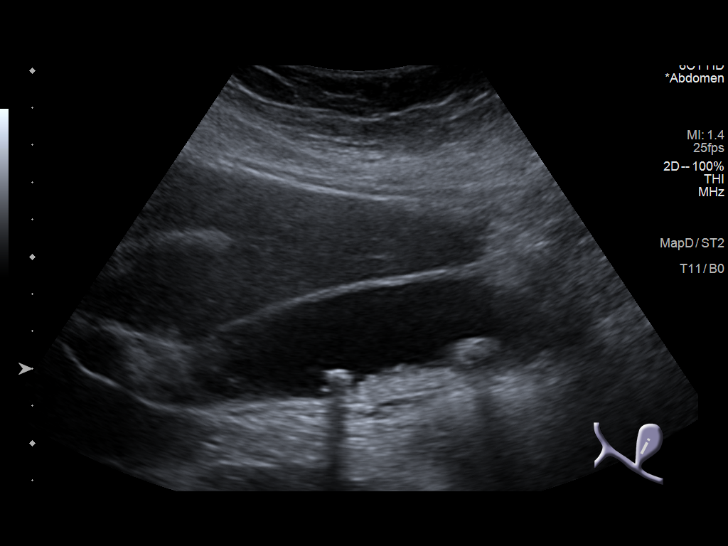
[im 10/109]
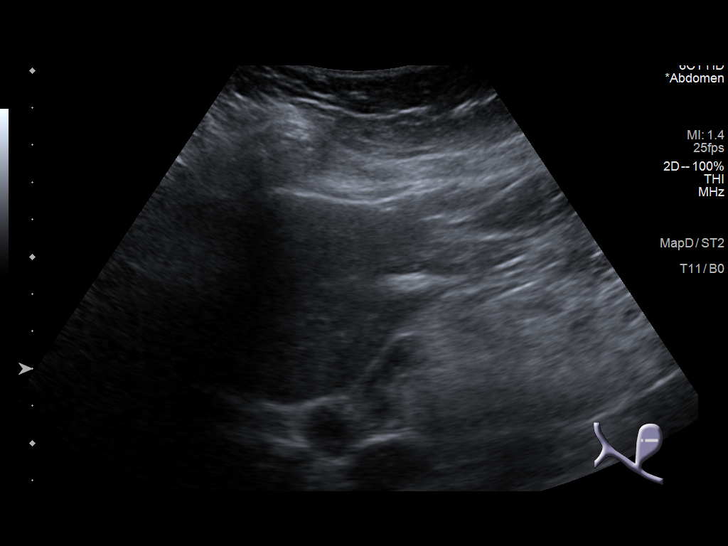
[im 19/109]
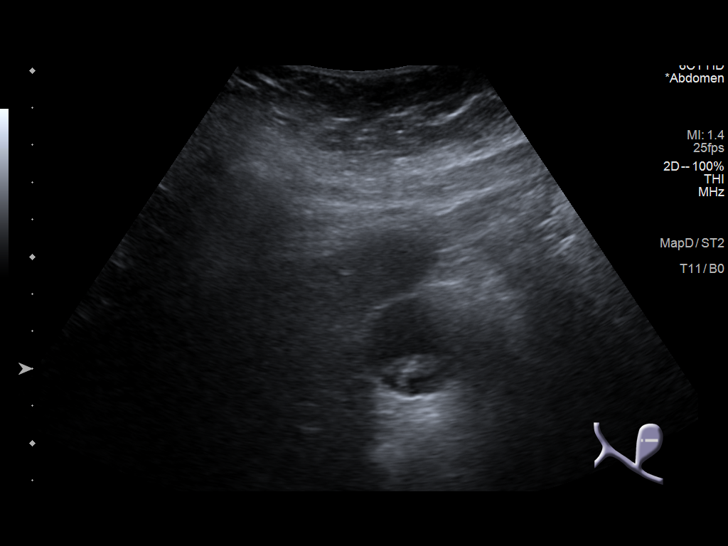
[im 28/109]
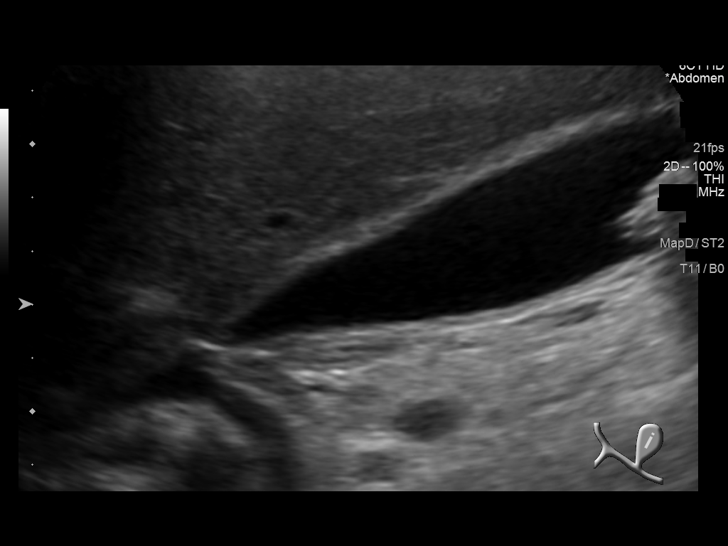
[im 37/109]
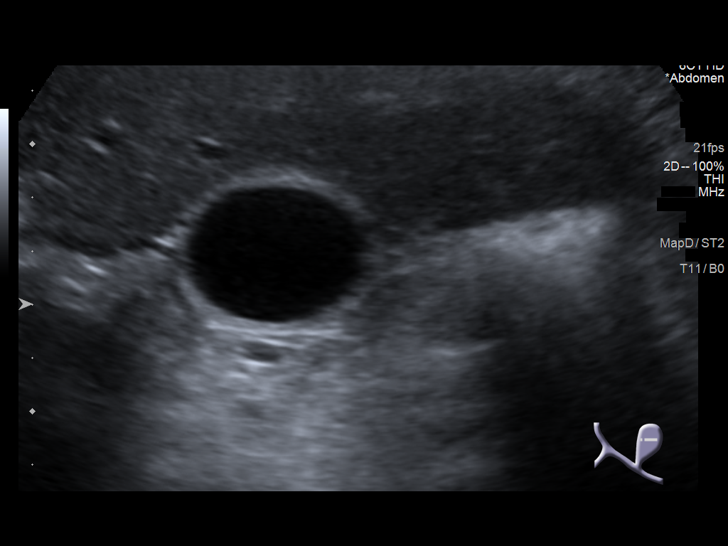
[im 41/109]
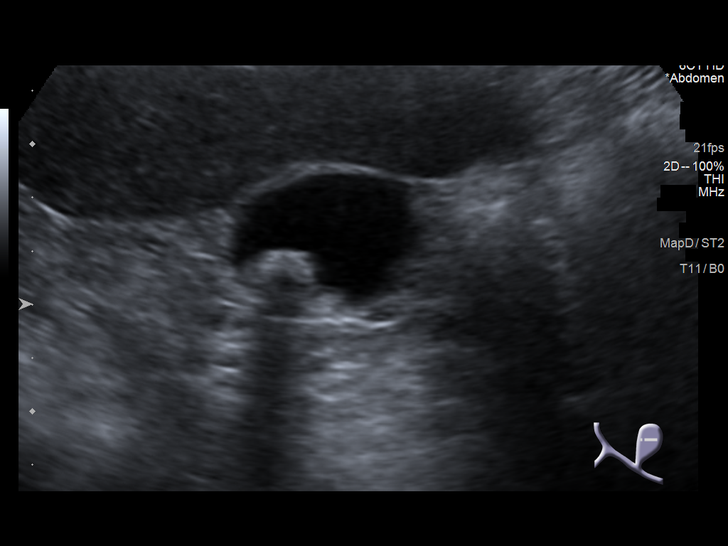
[im 50/109]
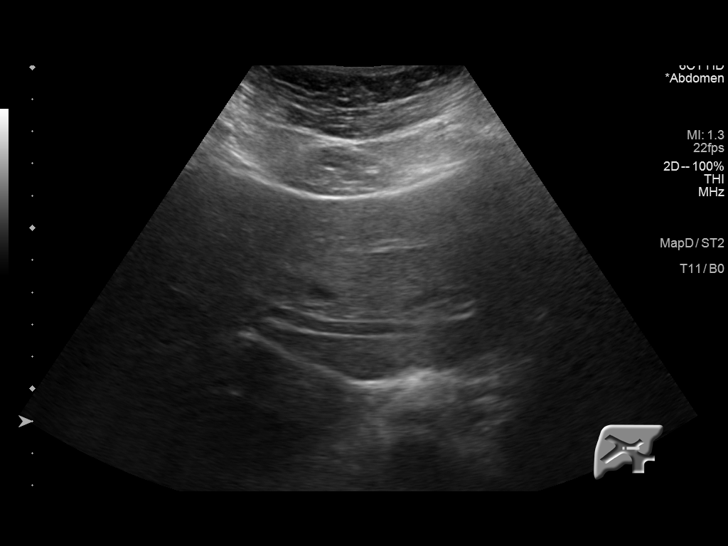
[im 59/109]
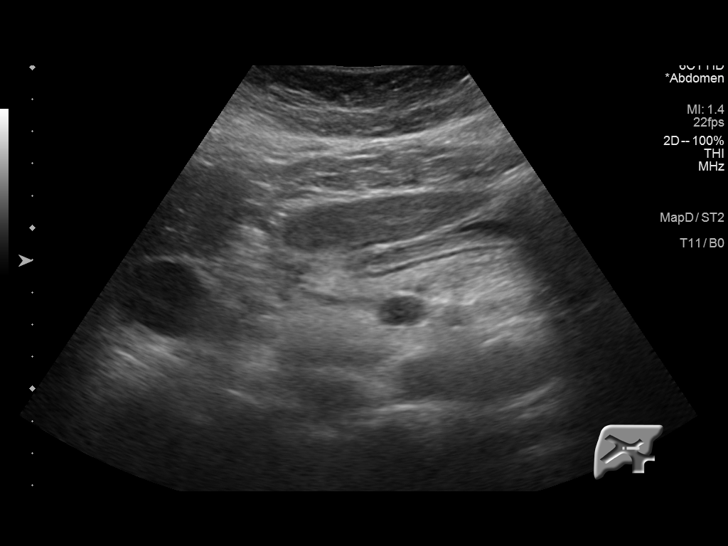
[im 68/109]
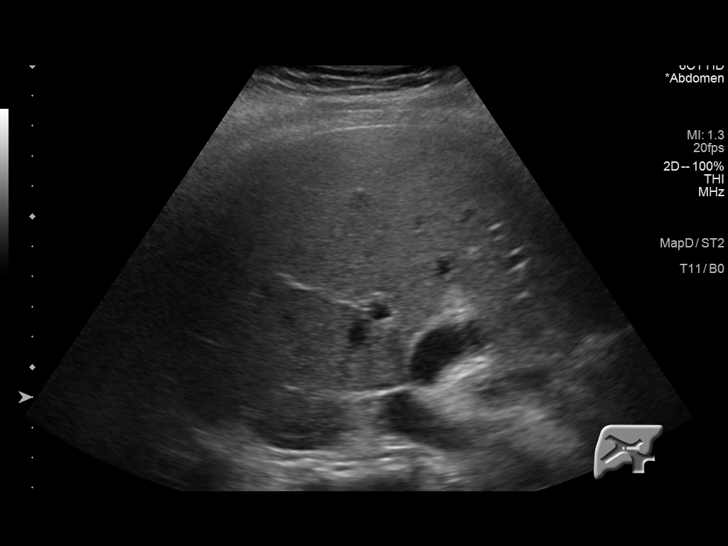
[im 73/109]
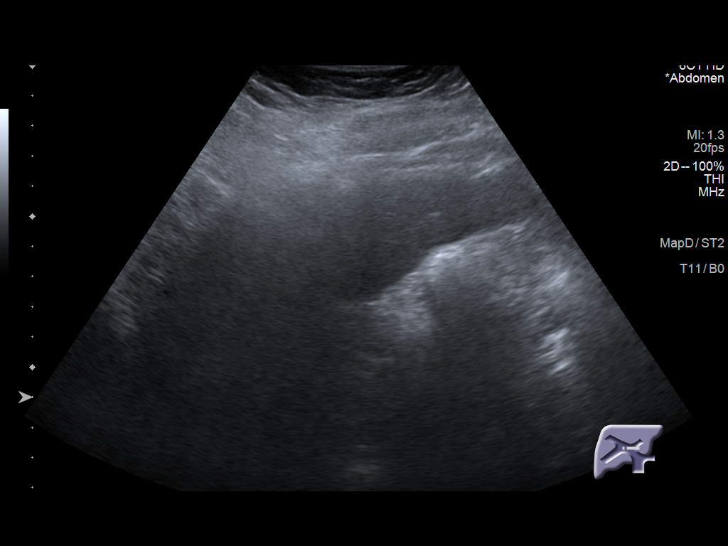
[im 82/109]
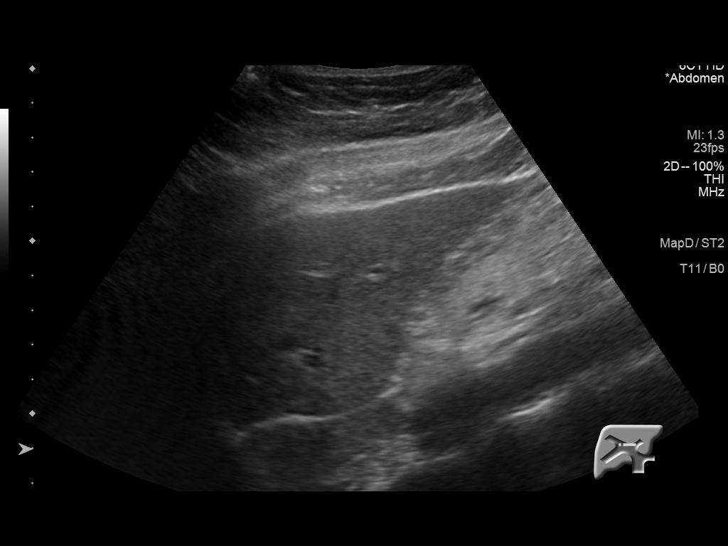
[im 91/109]
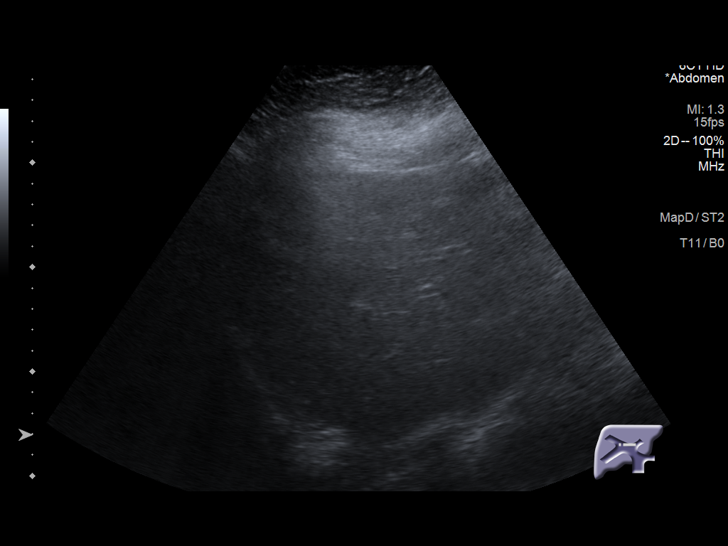
[im 100/109]
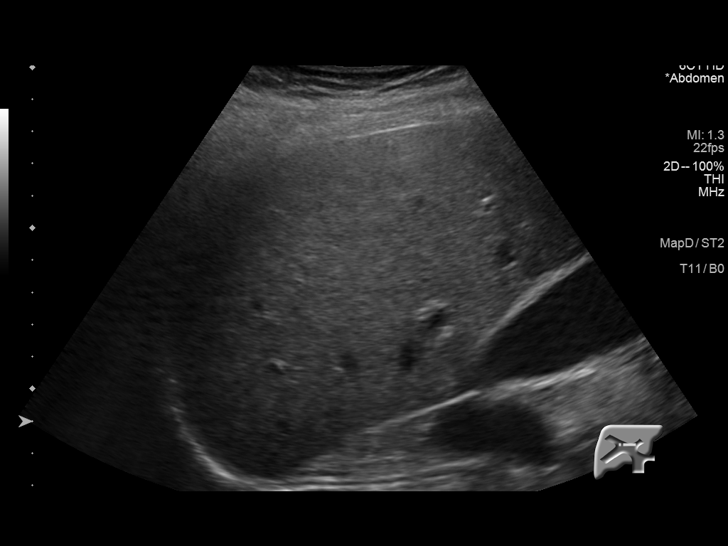
[im 109/109]
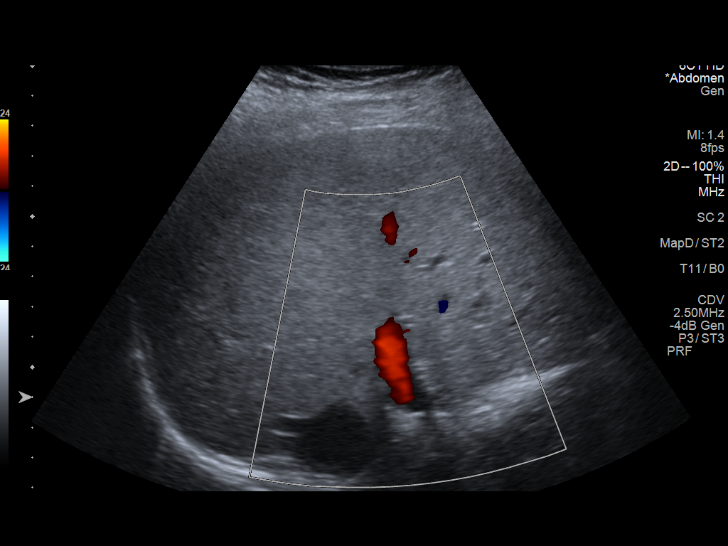

[14 of 25 positions shown; findings below may reference images not displayed]

FINDINGS: Gallbladder:

Cholelithiasis is noted without gallbladder wall thickening or
pericholecystic fluid. Some sludge is also present within
gallbladder lumen. No sonographic Murphy's sign is noted.

Common bile duct:

Diameter: 2 mm which is within normal limits.

Liver:

No focal lesion identified. Within normal limits in parenchymal
echogenicity. Portal vein is patent on color Doppler imaging with
normal direction of blood flow towards the liver.

Other: None.
IMPRESSION: Cholelithiasis without evidence of cholecystitis.
